# Patient Record
Sex: Male | Born: 1975 | Race: White | Hispanic: No | Marital: Married | Smoking: Former smoker
Health system: Southern US, Community
[De-identification: ages and names within clinical notes are randomized; demographics above are authoritative.]

## PROBLEM LIST (undated history)

## (undated) DIAGNOSIS — I10 Essential (primary) hypertension: Secondary | ICD-10-CM

---

## 2006-10-27 ENCOUNTER — Ambulatory Visit: Payer: Self-pay | Admitting: Internal Medicine

## 2006-12-08 ENCOUNTER — Ambulatory Visit: Payer: Self-pay | Admitting: Internal Medicine

## 2008-02-27 ENCOUNTER — Other Ambulatory Visit: Payer: Self-pay

## 2008-02-27 ENCOUNTER — Emergency Department: Payer: Self-pay | Admitting: Emergency Medicine

## 2008-08-21 ENCOUNTER — Emergency Department: Payer: Self-pay

## 2008-12-24 DIAGNOSIS — D239 Other benign neoplasm of skin, unspecified: Secondary | ICD-10-CM

## 2008-12-24 HISTORY — DX: Other benign neoplasm of skin, unspecified: D23.9

## 2011-10-20 ENCOUNTER — Ambulatory Visit: Payer: Self-pay | Admitting: Gastroenterology

## 2012-10-03 ENCOUNTER — Ambulatory Visit: Payer: Self-pay | Admitting: Sports Medicine

## 2012-11-23 ENCOUNTER — Ambulatory Visit: Payer: Self-pay | Admitting: Family Medicine

## 2017-08-07 DIAGNOSIS — G8929 Other chronic pain: Secondary | ICD-10-CM | POA: Insufficient documentation

## 2017-10-31 DIAGNOSIS — R002 Palpitations: Secondary | ICD-10-CM | POA: Insufficient documentation

## 2017-10-31 DIAGNOSIS — R079 Chest pain, unspecified: Secondary | ICD-10-CM | POA: Insufficient documentation

## 2017-10-31 DIAGNOSIS — R0602 Shortness of breath: Secondary | ICD-10-CM | POA: Insufficient documentation

## 2017-11-07 DIAGNOSIS — M5136 Other intervertebral disc degeneration, lumbar region: Secondary | ICD-10-CM | POA: Insufficient documentation

## 2019-03-21 DIAGNOSIS — K5732 Diverticulitis of large intestine without perforation or abscess without bleeding: Secondary | ICD-10-CM | POA: Insufficient documentation

## 2020-01-06 ENCOUNTER — Ambulatory Visit: Payer: 59 | Attending: Internal Medicine

## 2020-01-06 DIAGNOSIS — Z20822 Contact with and (suspected) exposure to covid-19: Secondary | ICD-10-CM | POA: Insufficient documentation

## 2020-01-07 LAB — NOVEL CORONAVIRUS, NAA: SARS-CoV-2, NAA: NOT DETECTED

## 2020-02-28 ENCOUNTER — Ambulatory Visit: Payer: Self-pay | Attending: Internal Medicine

## 2020-02-28 DIAGNOSIS — Z23 Encounter for immunization: Secondary | ICD-10-CM

## 2020-02-28 NOTE — Progress Notes (Signed)
   Covid-19 Vaccination Clinic  Name:  Henry Wu    MRN: [id number] DOB: 09/13/1976  02/28/2020  [name] was observed post Covid-19 immunization for 15 minutes without incident. He was provided with Vaccine Information Sheet and instruction to access the V-Safe system.   [name] was instructed to call 911 with any severe reactions post vaccine: Marland Kitchen Difficulty breathing  . Swelling of face and throat  . A fast heartbeat  . A bad rash all over body  . Dizziness and weakness   Immunizations Administered    Name Date Dose VIS Date Route   Pfizer COVID-19 Vaccine 02/28/2020 11:45 AM 0.3 mL 11/22/2019 Intramuscular   Manufacturer: [place]   Lot: [id number]   [place]: [id number]

## 2020-03-25 ENCOUNTER — Ambulatory Visit: Payer: Self-pay | Attending: Internal Medicine

## 2020-03-25 DIAGNOSIS — Z23 Encounter for immunization: Secondary | ICD-10-CM

## 2020-03-25 NOTE — Progress Notes (Signed)
   Covid-19 Vaccination Clinic  Name:  Henry Wu    MRN: [id number] DOB: 17-Mar-1976  03/25/2020  [name] was observed post Covid-19 immunization for 15 minutes without incident. He was provided with Vaccine Information Sheet and instruction to access the V-Safe system.   [name] was instructed to call 911 with any severe reactions post vaccine: Marland Kitchen Difficulty breathing  . Swelling of face and throat  . A fast heartbeat  . A bad rash all over body  . Dizziness and weakness   Immunizations Administered    Name Date Dose VIS Date Route   Pfizer COVID-19 Vaccine 03/25/2020 11:44 AM 0.3 mL 11/22/2019 Intramuscular   Manufacturer: [place]   Lot: [id number]   [place]: [id number]

## 2020-04-02 ENCOUNTER — Ambulatory Visit: Payer: Self-pay | Admitting: Dermatology

## 2020-05-14 ENCOUNTER — Other Ambulatory Visit: Payer: Self-pay | Admitting: Dermatology

## 2020-05-18 ENCOUNTER — Other Ambulatory Visit: Payer: Self-pay | Admitting: Dermatology

## 2020-06-08 ENCOUNTER — Ambulatory Visit (INDEPENDENT_AMBULATORY_CARE_PROVIDER_SITE_OTHER): Payer: 59 | Admitting: Dermatology

## 2020-06-08 ENCOUNTER — Other Ambulatory Visit: Payer: Self-pay

## 2020-06-08 DIAGNOSIS — L814 Other melanin hyperpigmentation: Secondary | ICD-10-CM

## 2020-06-08 DIAGNOSIS — L578 Other skin changes due to chronic exposure to nonionizing radiation: Secondary | ICD-10-CM

## 2020-06-08 DIAGNOSIS — D485 Neoplasm of uncertain behavior of skin: Secondary | ICD-10-CM

## 2020-06-08 DIAGNOSIS — L409 Psoriasis, unspecified: Secondary | ICD-10-CM | POA: Diagnosis not present

## 2020-06-08 DIAGNOSIS — D1800 Hemangioma unspecified site: Secondary | ICD-10-CM

## 2020-06-08 DIAGNOSIS — D229 Melanocytic nevi, unspecified: Secondary | ICD-10-CM

## 2020-06-08 DIAGNOSIS — Z1283 Encounter for screening for malignant neoplasm of skin: Secondary | ICD-10-CM

## 2020-06-08 MED ORDER — MOMETASONE FUROATE 0.1 % EX CREA: 1.0000 "application " | TOPICAL_CREAM | Freq: Every day | CUTANEOUS | 3 refills | Status: DC

## 2020-06-08 MED ORDER — MUPIROCIN 2 % EX OINT: 1.0000 "application " | TOPICAL_OINTMENT | Freq: Every day | CUTANEOUS | 2 refills | Status: DC

## 2020-06-08 NOTE — Patient Instructions (Addendum)

## 2020-06-08 NOTE — Progress Notes (Signed)
Follow-Up Visit   Subjective  Henry Wu is a 44 y.o. male who presents for the following: Follow-up. The patient presents for Upper Body Skin Exam (UBSE) for skin cancer screening and mole check. Patient here for 6 month psoriasis and eczema follow up. Patient uses Elocon for the eczema and needs refills.  Patient taking Donnetta Hail for psoriasis and is doing well. He would like a prescription for something topical to have on hand for flares when needed. Psoriasis is at chest and present for years. He has been on Pablo for about 3 years. He has also done Xtrac in the past.   The following portions of the chart were reviewed this encounter and updated as appropriate:  Allergies  Meds  Problems  Med Hx  Surg Hx  Fam Hx      Review of Systems:  No other skin or systemic complaints except as noted in HPI or Assessment and Plan.  Objective  Well appearing patient in no apparent distress; mood and affect are within normal limits.  A focused examination was performed including face, neck, chest and back and arms, feet, legs. Relevant physical exam findings are noted in the Assessment and Plan.  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  Start mupirocin to affected areas that are crusted daily.  Patient with some joint aches at the back, not sure if Donnetta Hail has helped. May need rheumatology evaluation.  Patient had labs done 10/03/2019 WNL. Will order labs next visit.   Ordered Medications: mometasone (ELOCON) 0.1 % cream mupirocin ointment  (BACTROBAN) 2 %  Neoplasm of uncertain behavior of skin (2) Left medial distal calf above the ankle  Epidermal / dermal shaving  Lesion diameter (cm):  0.5 Informed consent: discussed and consent obtained   Timeout: patient name, date of birth, surgical site, and procedure verified   Procedure prep:  Patient was prepped and draped in usual sterile fashion Prep type:  Isopropyl alcohol Anesthesia: the lesion was anesthetized in a standard fashion   Anesthetic:  1% lidocaine w/ epinephrine 1-100,000 buffered w/ 8.4% NaHCO3 Instrument used: flexible razor blade   Hemostasis achieved with: pressure, aluminum chloride and electrodesiccation   Outcome: patient tolerated procedure well   Post-procedure details: sterile dressing applied and wound care instructions given   Dressing type: bandage and petrolatum    Specimen 1 - Surgical pathology Differential Diagnosis: Nevus vs Dysplastic Nevus Check Margins: No 0.5cm irregular brown macule  Left proximal post calf  Epidermal / dermal shaving  Lesion diameter (cm):  0.3 Informed consent: discussed and consent obtained   Timeout: patient name, date of birth, surgical site, and procedure verified   Procedure prep:  Patient was prepped and draped in usual sterile fashion Prep type:  Isopropyl alcohol Anesthesia: the lesion was anesthetized in a standard fashion   Anesthetic:  1% lidocaine w/ epinephrine 1-100,000 buffered w/ 8.4% NaHCO3 Instrument used: flexible razor blade   Hemostasis achieved with: pressure, aluminum chloride and electrodesiccation   Outcome: patient tolerated procedure well   Post-procedure details: sterile dressing applied and  wound care instructions given   Dressing type: bandage and petrolatum    Specimen 2 - Surgical pathology Differential Diagnosis: Nevus vs Dysplastic Nevus Check Margins: No 0.3cm irregular brown macule  0.8cm post tx defect at left medial distal calf above ankle 0.6cm post tx defect at  left proximal post calf  Actinic Damage - diffuse scaly erythematous macules with underlying dyspigmentation - Recommend daily broad spectrum sunscreen SPF 30+ to sun-exposed areas, reapply every 2 hours as needed.  - Call for new or changing lesions.  Melanocytic Nevi - Tan-brown and/or pink-flesh-colored symmetric macules and papules - Benign appearing on exam today - Observation - Call clinic for new or changing moles - Recommend daily use of broad spectrum spf 30+ sunscreen to sun-exposed areas.   Lentigines - Scattered tan macules - Discussed due to sun exposure - Benign, observe - Call for any changes  Hemangiomas - Red papules - Discussed benign nature - Observe - Call for any changes  Return in about 6 months (around 12/08/2020) for psoriasis.  Graciella Belton, RMA, am acting as scribe for Sarina Ser, MD .  Documentation: I have reviewed the above documentation for accuracy and completeness, and I agree with the above.  Sarina Ser, MD

## 2020-06-11 ENCOUNTER — Encounter: Payer: Self-pay | Admitting: Dermatology

## 2020-06-11 ENCOUNTER — Telehealth: Payer: Self-pay

## 2020-06-11 NOTE — Telephone Encounter (Signed)
-----   Message from Ralene Bathe, MD sent at 06/11/2020  9:00 AM EDT ----- 1. Skin , left medial distal calf above the ankle SOLAR LENTIGO 2. 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

## 2020-06-11 NOTE — Telephone Encounter (Signed)
Patient informed of pathology results 

## 2020-06-21 ENCOUNTER — Encounter: Payer: Self-pay | Admitting: Dermatology

## 2020-07-29 ENCOUNTER — Other Ambulatory Visit: Payer: Self-pay

## 2020-07-29 MED ORDER — TALTZ 80 MG/ML ~~LOC~~ SOAJ
SUBCUTANEOUS | 2 refills | Status: DC
Start: 2020-07-29 — End: 2020-10-15

## 2020-07-29 NOTE — Progress Notes (Signed)
Refills needed for Taltz prescription. aw

## 2020-10-15 ENCOUNTER — Other Ambulatory Visit: Payer: Self-pay

## 2020-10-15 MED ORDER — TALTZ 80 MG/ML ~~LOC~~ SOAJ: SUBCUTANEOUS | 2 refills | Status: DC

## 2020-10-15 NOTE — Progress Notes (Signed)
RX RF sent in per patients request.

## 2020-11-02 ENCOUNTER — Other Ambulatory Visit: Payer: Self-pay

## 2020-11-02 ENCOUNTER — Ambulatory Visit (INDEPENDENT_AMBULATORY_CARE_PROVIDER_SITE_OTHER): Payer: 59 | Admitting: Dermatology

## 2020-11-02 DIAGNOSIS — L578 Other skin changes due to chronic exposure to nonionizing radiation: Secondary | ICD-10-CM

## 2020-11-02 DIAGNOSIS — L409 Psoriasis, unspecified: Secondary | ICD-10-CM | POA: Diagnosis not present

## 2020-11-02 DIAGNOSIS — D229 Melanocytic nevi, unspecified: Secondary | ICD-10-CM

## 2020-11-02 DIAGNOSIS — L820 Inflamed seborrheic keratosis: Secondary | ICD-10-CM | POA: Diagnosis not present

## 2020-11-02 DIAGNOSIS — D2361 Other benign neoplasm of skin of right upper limb, including shoulder: Secondary | ICD-10-CM

## 2020-11-02 DIAGNOSIS — L814 Other melanin hyperpigmentation: Secondary | ICD-10-CM

## 2020-11-02 DIAGNOSIS — L918 Other hypertrophic disorders of the skin: Secondary | ICD-10-CM

## 2020-11-02 DIAGNOSIS — D1800 Hemangioma unspecified site: Secondary | ICD-10-CM

## 2020-11-02 DIAGNOSIS — D485 Neoplasm of uncertain behavior of skin: Secondary | ICD-10-CM

## 2020-11-02 DIAGNOSIS — L821 Other seborrheic keratosis: Secondary | ICD-10-CM

## 2020-11-02 DIAGNOSIS — Z1283 Encounter for screening for malignant neoplasm of skin: Secondary | ICD-10-CM | POA: Diagnosis not present

## 2020-11-02 NOTE — Progress Notes (Signed)
Follow-Up Visit   Subjective  Henry Wu is a 44 y.o. male who presents for the following: Follow-up (Patient here today for 6 month psoriasis follow up. He is using Materials engineer monthly and Elocon as needed. ).  Patient advises he only has a few scattered plaques. His specialty pharmacy had messed up his Taltz prescription so he went without for 2-3 months per patient.  Patient due for labs today.  The patient presents for Total-Body Skin Exam (TBSE) for skin cancer screening and mole check.  The following portions of the chart were reviewed this encounter and updated as appropriate:  Allergies  Meds  Problems  Med Hx  Surg Hx  Fam Hx     Review of Systems:  No other skin or systemic complaints except as noted in HPI or Assessment and Plan.  Objective  Well appearing patient in no apparent distress; mood and affect are within normal limits.  A focused examination was performed including face, neck, chest and back and legs, hands, arms. Relevant physical exam findings are noted in the Assessment and Plan.  Objective  Right Lower Leg - Anterior: Guttate spots on legs and hands  Objective  Right Anterior Deltoid: 0.6cm pink papule  Objective  Left epigastric: Erythematous keratotic or waxy stuck-on papule or plaque.    Assessment & Plan  Psoriasis Right Lower Leg - Anterior  Patient does have back pain If pain does not improve on Taltz, recommend patient be re-evaluated by rheumatologist.   RE-Start Taltz monthly Continue Elocon daily as needed  Psoriasis - severe on systemic "biologic" treatment injections.  Psoriasis is a chronic non-curable, but treatable genetic/hereditary disease that may have other systemic features affecting other organ systems such as joints (Psoriatic Arthritis).  It is linked with heart disease, inflammatory bowel disease, non-alcoholic fatty liver disease, and depression. Significant skin psoriasis and/or psoriatic arthritis may have  significant symptoms and affects activities of daily activity and often benefits from systemic "biologic" injection treatments.  These "biologic" treatments have some potential side effects including immunosuppression and require pre-treatment laboratory screening and periodic laboratory monitoring and periodic in person evaluation and monitoring by the attending dermatologist physician.  Other Related Procedures Comprehensive metabolic panel CBC with Differential/Platelet Lipid panel QuantiFERON-TB Gold Plus  Other Related Medications mometasone (ELOCON) 0.1 % cream  Neoplasm of uncertain behavior of skin Right Anterior Deltoid  Epidermal / dermal shaving  Lesion diameter (cm):  0.6 Informed consent: discussed and consent obtained   Timeout: patient name, date of birth, surgical site, and procedure verified   Procedure prep:  Patient was prepped and draped in usual sterile fashion Prep type:  Isopropyl alcohol Anesthesia: the lesion was anesthetized in a standard fashion   Anesthetic:  1% lidocaine w/ epinephrine 1-100,000 buffered w/ 8.4% NaHCO3 Instrument used: flexible razor blade   Hemostasis achieved with: pressure, aluminum chloride and electrodesiccation   Outcome: patient tolerated procedure well   Post-procedure details: sterile dressing applied and wound care instructions given   Dressing type: bandage and petrolatum    Destruction of lesion Complexity: extensive   Destruction method: electrodesiccation and curettage   Informed consent: discussed and consent obtained   Timeout:  patient name, date of birth, surgical site, and procedure verified Procedure prep:  Patient was prepped and draped in usual sterile fashion Prep type:  Isopropyl alcohol Anesthesia: the lesion was anesthetized in a standard fashion   Anesthetic:  1% lidocaine w/ epinephrine 1-100,000 buffered w/ 8.4% NaHCO3 Curettage performed in three different directions: Yes  Electrodesiccation performed  over the curetted area: Yes   Lesion length (cm):  0.6 Lesion width (cm):  0.6 Margin per side (cm):  0.2 Final wound size (cm):  1 Hemostasis achieved with:  pressure, aluminum chloride and electrodesiccation Outcome: patient tolerated procedure well with no complications   Post-procedure details: sterile dressing applied and wound care instructions given   Dressing type: bandage and petrolatum    Specimen 1 - Surgical pathology Differential Diagnosis: r/o BCC vs Dermatofibroma  Check Margins: No 0.6cm pink papule  Inflamed seborrheic keratosis Left epigastric  Destruction of lesion - Left epigastric Complexity: simple   Destruction method: cryotherapy   Informed consent: discussed and consent obtained   Timeout:  patient name, date of birth, surgical site, and procedure verified Lesion destroyed using liquid nitrogen: Yes   Region frozen until ice ball extended beyond lesion: Yes   Outcome: patient tolerated procedure well with no complications   Post-procedure details: wound care instructions given    History of Dysplastic Nevi - No evidence of recurrence today at left prox post calf - Recommend regular full body skin exams - Recommend daily broad spectrum sunscreen SPF 30+ to sun-exposed areas, reapply every 2 hours as needed.  - Call if any new or changing lesions are noted between office visits  Lentigines - Scattered tan macules - Discussed due to sun exposure - Benign, observe - Call for any changes  Seborrheic Keratoses - Stuck-on, waxy, tan-brown papules and plaques  - Discussed benign etiology and prognosis. - Observe - Call for any changes  Melanocytic Nevi - Tan-brown and/or pink-flesh-colored symmetric macules and papules - Benign appearing on exam today - Observation - Call clinic for new or changing moles - Recommend daily use of broad spectrum spf 30+ sunscreen to sun-exposed areas.   Hemangiomas - Red papules - Discussed benign nature -  Observe - Call for any changes  Actinic Damage - Chronic, secondary to cumulative UV/sun exposure - diffuse scaly erythematous macules with underlying dyspigmentation - Recommend daily broad spectrum sunscreen SPF 30+ to sun-exposed areas, reapply every 2 hours as needed.  - Call for new or changing lesions.  Skin cancer screening performed today.  Acrochordons (Skin Tags) - Fleshy, skin-colored pedunculated papules - Benign appearing.  - Observe. - If desired, they can be removed with an in office procedure that is not covered by insurance. - Please call the clinic if you notice any new or changing lesions.  Return in about 6 months (around 05/02/2021) for Psoriasis.  Graciella Belton, RMA, am acting as scribe for Sarina Ser, MD . Documentation: I have reviewed the above documentation for accuracy and completeness, and I agree with the above.  Sarina Ser, MD

## 2020-11-02 NOTE — Patient Instructions (Addendum)
Melanoma ABCDEs  Melanoma is the most dangerous type of skin cancer, and is the leading cause of death from skin disease.  You are more likely to develop melanoma if you:  Have light-colored skin, light-colored eyes, or red or blond hair  Spend a lot of time in the sun  Tan regularly, either outdoors or in a tanning bed  Have had blistering sunburns, especially during childhood  Have a close family member who has had a melanoma  Have atypical moles or large birthmarks  Early detection of melanoma is key since treatment is typically straightforward and cure rates are extremely high if we catch it early.   The first sign of melanoma is often a change in a mole or a new dark spot.  The ABCDE system is a way of remembering the signs of melanoma.  A for asymmetry:  The two halves do not match. B for border:  The edges of the growth are irregular. C for color:  A mixture of colors are present instead of an even brown color. D for diameter:  Melanomas are usually (but not always) greater than 21mm - the size of a pencil eraser. E for evolution:  The spot keeps changing in size, shape, and color.  Please check your skin once per month between visits. You can use a small mirror in front and a large mirror behind you to keep an eye on the back side or your body.   If you see any new or changing lesions before your next follow-up, please call to schedule a visit.  Please continue daily skin protection including broad spectrum sunscreen SPF 30+ to sun-exposed areas, reapplying every 2 hours as needed when you're outdoors.   Wound Care Instructions  1. Cleanse wound gently with soap and water once a day then pat dry with clean gauze. Apply a thing coat of Petrolatum (petroleum jelly, "Vaseline") over the wound (unless you have an allergy to this). We recommend that you use a new, sterile tube of Vaseline. Do not pick or remove scabs. Do not remove the yellow or white "healing tissue" from the  base of the wound.  2. Cover the wound with fresh, clean, nonstick gauze and secure with paper tape. You may use Band-Aids in place of gauze and tape if the would is small enough, but would recommend trimming much of the tape off as there is often too much. Sometimes Band-Aids can irritate the skin.  3. You should call the office for your biopsy report after 1 week if you have not already been contacted.  4. If you experience any problems, such as abnormal amounts of bleeding, swelling, significant bruising, significant pain, or evidence of infection, please call the office immediately.  5. FOR ADULT SURGERY PATIENTS: If you need something for pain relief you may take 1 extra strength Tylenol (acetaminophen) AND 2 Ibuprofen (200mg  each) together every 4 hours as needed for pain. (do not take these if you are allergic to them or if you have a reason you should not take them.) Typically, you may only need pain medication for 1 to 3 days.    Cryotherapy Aftercare  . Wash gently with soap and water everyday.   Marland Kitchen Apply Vaseline and Band-Aid daily until healed.

## 2020-11-04 ENCOUNTER — Telehealth: Payer: Self-pay

## 2020-11-04 LAB — CBC WITH DIFFERENTIAL/PLATELET
Basophils Absolute: 0 10*3/uL (ref 0.0–0.2)
Basos: 1 %
EOS (ABSOLUTE): 0.2 10*3/uL (ref 0.0–0.4)
Eos: 2 %
Hematocrit: 38.8 % (ref 37.5–51.0)
Hemoglobin: 13.4 g/dL (ref 13.0–17.7)
Immature Grans (Abs): 0 10*3/uL (ref 0.0–0.1)
Immature Granulocytes: 0 %
Lymphocytes Absolute: 2 10*3/uL (ref 0.7–3.1)
Lymphs: 26 %
MCH: 31.3 pg (ref 26.6–33.0)
MCHC: 34.5 g/dL (ref 31.5–35.7)
MCV: 91 fL (ref 79–97)
Monocytes Absolute: 0.7 10*3/uL (ref 0.1–0.9)
Monocytes: 10 %
Neutrophils Absolute: 4.7 10*3/uL (ref 1.4–7.0)
Neutrophils: 61 %
Platelets: 204 10*3/uL (ref 150–450)
RBC: 4.28 x10E6/uL (ref 4.14–5.80)
RDW: 13.1 % (ref 11.6–15.4)
WBC: 7.7 10*3/uL (ref 3.4–10.8)

## 2020-11-04 LAB — LIPID PANEL
Chol/HDL Ratio: 3.3 ratio (ref 0.0–5.0)
Cholesterol, Total: 167 mg/dL (ref 100–199)
HDL: 50 mg/dL (ref >39)
LDL Chol Calc (NIH): 96 mg/dL (ref 0–99)
Triglycerides: 115 mg/dL (ref 0–149)
VLDL Cholesterol Cal: 21 mg/dL (ref 5–40)

## 2020-11-04 LAB — QUANTIFERON-TB GOLD PLUS
QuantiFERON Mitogen Value: >10 IU/mL
QuantiFERON Nil Value: 0.41 IU/mL
QuantiFERON TB1 Ag Value: 1.06 IU/mL
QuantiFERON TB2 Ag Value: 1.05 IU/mL
QuantiFERON-TB Gold Plus: POSITIVE — AB

## 2020-11-04 LAB — COMPREHENSIVE METABOLIC PANEL
ALT: 28 IU/L (ref 0–44)
AST: 18 IU/L (ref 0–40)
Albumin/Globulin Ratio: 1.7 (ref 1.2–2.2)
Albumin: 4.4 g/dL (ref 4.0–5.0)
Alkaline Phosphatase: 50 IU/L (ref 44–121)
BUN/Creatinine Ratio: 13 (ref 9–20)
BUN: 17 mg/dL (ref 6–24)
Bilirubin Total: 0.5 mg/dL (ref 0.0–1.2)
CO2: 24 mmol/L (ref 20–29)
Calcium: 9.5 mg/dL (ref 8.7–10.2)
Chloride: 103 mmol/L (ref 96–106)
Creatinine, Ser: 1.29 mg/dL — ABNORMAL HIGH (ref 0.76–1.27)
GFR calc Af Amer: 79 mL/min/{1.73_m2} (ref >59)
GFR calc non Af Amer: 68 mL/min/{1.73_m2} (ref >59)
Globulin, Total: 2.6 g/dL (ref 1.5–4.5)
Glucose: 87 mg/dL (ref 65–99)
Potassium: 4.4 mmol/L (ref 3.5–5.2)
Sodium: 141 mmol/L (ref 134–144)
Total Protein: 7 g/dL (ref 6.0–8.5)

## 2020-11-04 NOTE — Telephone Encounter (Signed)
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

## 2020-11-04 NOTE — Telephone Encounter (Signed)
Patient informed of pathology results 

## 2020-11-10 ENCOUNTER — Encounter: Payer: Self-pay | Admitting: Dermatology

## 2020-11-10 ENCOUNTER — Telehealth: Payer: Self-pay

## 2020-11-10 NOTE — Telephone Encounter (Signed)
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  (May be a false positive.  1st step is to repeat lab - Quantiferon gold.  Please advise pt and order again.)  If 2nd test positive, will need to have Infectious Disease evaluation)  All other lab are Ok/normal.

## 2020-11-10 NOTE — Telephone Encounter (Signed)
LM on VM please return my call to discuss lab results  

## 2020-11-12 ENCOUNTER — Telehealth: Payer: Self-pay

## 2020-11-12 NOTE — Telephone Encounter (Signed)
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  (May be a false positive.  1st step is to repeat lab - Quantiferon gold.  Please advise pt and order again.)  If 2nd test positive, will need to have Infectious Disease evaluation)  All other lab are Ok/normal.

## 2020-11-12 NOTE — Telephone Encounter (Signed)
Left message on voicemail to return my call.  

## 2020-11-24 ENCOUNTER — Telehealth: Payer: Self-pay

## 2020-11-24 NOTE — Telephone Encounter (Signed)
Left message on voicemail to return my call.  

## 2020-11-24 NOTE — Telephone Encounter (Signed)
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  (May be a false positive.  1st step is to repeat lab - Quantiferon gold.  Please advise pt and order again.)  If 2nd test positive, will need to have Infectious Disease evaluation)  All other lab are Ok/normal.

## 2020-11-24 NOTE — Progress Notes (Signed)
Sent letter to advise patient of lab results and the need to have Quantiferon Gold lab redrawn.

## 2020-11-30 ENCOUNTER — Telehealth: Payer: Self-pay

## 2020-11-30 DIAGNOSIS — L409 Psoriasis, unspecified: Secondary | ICD-10-CM

## 2020-11-30 NOTE — Telephone Encounter (Signed)
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 (May be a false positive. 1st step is to repeat lab - Quantiferon gold. Please advise pt and order again.) If 2nd test positive, will need to have Infectious Disease evaluation)  Pt will come by today to pick up a lab order for TB test

## 2020-12-05 LAB — QUANTIFERON-TB GOLD PLUS
QuantiFERON Mitogen Value: >10 IU/mL
QuantiFERON Nil Value: 0.49 IU/mL
QuantiFERON TB1 Ag Value: 0.88 IU/mL
QuantiFERON TB2 Ag Value: 0.68 IU/mL
QuantiFERON-TB Gold Plus: POSITIVE — AB

## 2020-12-08 ENCOUNTER — Telehealth: Payer: Self-pay

## 2020-12-08 ENCOUNTER — Other Ambulatory Visit: Payer: Self-pay

## 2020-12-08 DIAGNOSIS — R7611 Nonspecific reaction to tuberculin skin test without active tuberculosis: Secondary | ICD-10-CM

## 2020-12-08 DIAGNOSIS — L409 Psoriasis, unspecified: Secondary | ICD-10-CM

## 2020-12-08 NOTE — Telephone Encounter (Signed)
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  Pt had negative TB test prior to starting Taltz. Please contact Dr Champ Mungo or Dr Rivka Safer - both are Infectious Disease specialists  And schedule appt.  Advise of : "positive TB test x 2 as above and pt currently on Biologic medication Taltz for Psoriasis"  Pt needs seen ASAP.  If he cannot be seen soon, ask Infectious Disease doctor if they want [place] to do any other testing or start any treatment in the meantime.

## 2020-12-08 NOTE — Telephone Encounter (Signed)
Pt scheduled with Dr Rivka Safer Tuesday January 4 at 10:15, pt aware of this appointment, I will fax labs to Dr Rivka Safer

## 2020-12-08 NOTE — Telephone Encounter (Signed)
Called pt discussed 2nd positive TB test we will refer him to infectious disease specialist, I will call pt back with appt information.

## 2020-12-15 ENCOUNTER — Encounter: Payer: Self-pay | Admitting: Infectious Diseases

## 2020-12-15 ENCOUNTER — Other Ambulatory Visit
Admission: RE | Admit: 2020-12-15 | Discharge: 2020-12-15 | Disposition: A | Discharge status: Home / Self Care | Payer: 59 | Source: Ambulatory Visit | Attending: Infectious Diseases | Admitting: Infectious Diseases

## 2020-12-15 ENCOUNTER — Ambulatory Visit: Payer: 59 | Admitting: Infectious Diseases

## 2020-12-15 ENCOUNTER — Ambulatory Visit: Payer: 59 | Attending: Infectious Diseases | Admitting: Infectious Diseases

## 2020-12-15 ENCOUNTER — Ambulatory Visit
Admission: RE | Admit: 2020-12-15 | Discharge: 2020-12-15 | Disposition: A | Discharge status: Home / Self Care | Payer: 59 | Source: Ambulatory Visit | Attending: Infectious Diseases | Admitting: Infectious Diseases

## 2020-12-15 ENCOUNTER — Other Ambulatory Visit: Payer: Self-pay

## 2020-12-15 ENCOUNTER — Ambulatory Visit: Payer: 59

## 2020-12-15 VITALS — BP 140/93 | HR 74 | Resp 16 | Ht 72.0 in | Wt 282.0 lb

## 2020-12-15 DIAGNOSIS — Z886 Allergy status to analgesic agent status: Secondary | ICD-10-CM | POA: Insufficient documentation

## 2020-12-15 DIAGNOSIS — Z23 Encounter for immunization: Secondary | ICD-10-CM

## 2020-12-15 DIAGNOSIS — Z87891 Personal history of nicotine dependence: Secondary | ICD-10-CM | POA: Insufficient documentation

## 2020-12-15 DIAGNOSIS — L409 Psoriasis, unspecified: Secondary | ICD-10-CM | POA: Diagnosis present

## 2020-12-15 DIAGNOSIS — Z79899 Other long term (current) drug therapy: Secondary | ICD-10-CM | POA: Diagnosis not present

## 2020-12-15 DIAGNOSIS — Z227 Latent tuberculosis: Secondary | ICD-10-CM | POA: Diagnosis present

## 2020-12-15 DIAGNOSIS — I10 Essential (primary) hypertension: Secondary | ICD-10-CM | POA: Diagnosis not present

## 2020-12-15 DIAGNOSIS — Z86018 Personal history of other benign neoplasm: Secondary | ICD-10-CM | POA: Diagnosis not present

## 2020-12-15 DIAGNOSIS — R7612 Nonspecific reaction to cell mediated immunity measurement of gamma interferon antigen response without active tuberculosis: Secondary | ICD-10-CM | POA: Diagnosis not present

## 2020-12-15 LAB — HEPATIC FUNCTION PANEL
ALT: 34 U/L (ref 0–44)
AST: 21 U/L (ref 15–41)
Albumin: 4.3 g/dL (ref 3.5–5.0)
Alkaline Phosphatase: 45 U/L (ref 38–126)
Bilirubin, Direct: 0.1 mg/dL (ref 0.0–0.2)
Indirect Bilirubin: 0.9 mg/dL (ref 0.3–0.9)
Total Bilirubin: 1 mg/dL (ref 0.3–1.2)
Total Protein: 7.7 g/dL (ref 6.5–8.1)

## 2020-12-15 LAB — HEPATITIS PANEL, ACUTE
HCV Ab: NONREACTIVE
Hep A IgM: NONREACTIVE
Hep B C IgM: NONREACTIVE
Hepatitis B Surface Ag: NONREACTIVE

## 2020-12-15 MED ORDER — RIFAMPIN 300 MG PO CAPS: 600.0000 mg | ORAL_CAPSULE | Freq: Every day | ORAL | 3 refills | Status: DC

## 2020-12-15 NOTE — Progress Notes (Signed)
NAME: Henry Wu  DOB: 24-May-1976  MRN: [id number]  Date/Time: 12/15/2020 10:24 AM  Subjective:  Patient is referred to me by his dermatologist Dr. Nehemiah Massed for a positive QuantiFERON gold. ? Henry Wu is a 45 y.o. with a history of psoriasis, HTN is referred to me for positive quantiferon  gold Patient is on ixekizumab an antiinterleukin 17 monoclonal antibody for the past 2 years.  He has had yearly QuantiFERON gold which has been negative therefore but this time it became positive.  It was repeated in November and it was positive again and hence he was referred to me.  Patient does not have any cough, fever, night sweats, weight loss or loss of appetite. In the early 2000 he used to work at [employer] and has been exposed to TB in the specimens.  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 He is not on any steroids.  Past Medical History:  Diagnosis Date  . Dysplastic nevus 12/24/2008   Left mid to low back, paraspinal 0.6cm. Slight to moderate atypia, margins free.  Marland Kitchen Dysplastic nevus 06/08/2020   Left proximal post. calf. Moderate atypia, close to margin.     No past surgical history on file.  Social History   Socioeconomic History  . Marital status: Married    Spouse name: Not on file  . Number of children: Not on file  . Years of education: Not on file  . Highest education level: Not on file  Occupational History  . Not on file  Tobacco Use  . Smoking status: Former Smoker    Types: Cigarettes    Start date: 12/15/2000  . Smokeless tobacco: Never Used  Substance and Sexual Activity  . Alcohol use: Yes    Comment: occasionally   . Drug use: Never  . Sexual activity: Not on file  Other Topics Concern  . Not on file  Social History Narrative  . Not on file   Social Determinants of Health   Financial Resource Strain: Not on file  Food Insecurity:  Not on file  Transportation Needs: Not on file  Physical Activity: Not on file  Stress: Not on file  Social Connections: Not on file  Intimate Partner Violence: Not on file    No family history on file. Allergies  Allergen Reactions  . Aspirin Other (See Comments)    Other reaction(s): Unknown Causing stomach upset     ? Current Outpatient Medications  Medication Sig Dispense Refill  . ALPRAZolam (XANAX) 0.25 MG tablet Take by mouth.    . cetirizine (ZYRTEC) 10 MG tablet Take by mouth.    . fluticasone (FLONASE) 50 MCG/ACT nasal spray Place into the nose.    . Ixekizumab (TALTZ) 80 MG/ML SOAJ Inject 1 pen sq every 4 weeks 1 mL 2  . losartan (COZAAR) 50 MG tablet Take 50 mg by mouth daily.    . mometasone (ELOCON) 0.1 % cream Apply 1 application topically daily. 45 g 3  . Multiple Vitamin (MULTI-VITAMIN) tablet Take by mouth.    . pantoprazole (PROTONIX) 40 MG tablet Take 40 mg by mouth daily.     No current facility-administered medications for this visit.     Abtx:  Anti-infectives (From admission, onward)   None      REVIEW OF SYSTEMS:  Const: negative fever, negative chills, negative weight loss Eyes: negative diplopia or visual changes,  negative eye pain ENT: negative coryza, negative sore throat Resp: negative cough, hemoptysis, dyspnea Cards: negative for chest pain, palpitations, lower extremity edema GU: negative for frequency, dysuria and hematuria GI: Negative for abdominal pain, diarrhea, bleeding, constipation Skin: negative for rash and pruritus Heme: negative for easy bruising and gum/nose bleeding MS: negative for myalgias, arthralgias, back pain and muscle weakness Neurolo:negative for headaches, dizziness, vertigo, memory problems  Psych: negative for feelings of anxiety, depression  Endocrine: negative for thyroid, diabetes Allergy/Immunology-aspirin: Objective:  VITALS:  BP (!) 140/93   Pulse 74   Resp 16   Ht 6' (1.829 m)   Wt 282 lb  (127.9 kg)   SpO2 98%   BMI 38.25 kg/m  PHYSICAL EXAM:  General: Alert, cooperative, no distress, appears stated age.  Head: Normocephalic, without obvious abnormality, atraumatic. Eyes: Conjunctivae clear, anicteric sclerae. Pupils are equal ENT Nares normal. No drainage or sinus tenderness. Lips, mucosa, and tongue normal. No Thrush Neck: Supple, symmetrical, no adenopathy, thyroid: non tender no carotid bruit and no JVD. Back: No CVA tenderness. Lungs: Clear to auscultation bilaterally. No Wheezing or Rhonchi. No rales. Heart: Regular rate and rhythm, no murmur, rub or gallop. Abdomen: Soft, non-tender,not distended. Bowel sounds normal. No masses Extremities: atraumatic, no cyanosis. No edema. No clubbing Skin: No rashes or lesions. Or bruising Lymph: Cervical, supraclavicular normal. Neurologic: Grossly non-focal Pertinent Labs Lab Results CBC    Component Value Date/Time   WBC 7.7 11/02/2020 1002   RBC 4.28 11/02/2020 1002   HGB 13.4 11/02/2020 1002   HCT 38.8 11/02/2020 1002   PLT 204 11/02/2020 1002   MCV 91 11/02/2020 1002   MCH 31.3 11/02/2020 1002   MCHC 34.5 11/02/2020 1002   RDW 13.1 11/02/2020 1002   LYMPHSABS 2.0 11/02/2020 1002   EOSABS 0.2 11/02/2020 1002   BASOSABS 0.0 11/02/2020 1002    CMP Latest Ref Rng & Units 11/02/2020  Glucose 65 - 99 mg/dL 87  BUN 6 - 24 mg/dL 17  Creatinine 0.76 - 1.27 mg/dL 1.29(H)  Sodium 134 - 144 mmol/L 141  Potassium 3.5 - 5.2 mmol/L 4.4  Chloride 96 - 106 mmol/L 103  CO2 20 - 29 mmol/L 24  Calcium 8.7 - 10.2 mg/dL 9.5  Total Protein 6.0 - 8.5 g/dL 7.0  Total Bilirubin 0.0 - 1.2 mg/dL 0.5  Alkaline Phos 44 - 121 IU/L 50  AST 0 - 40 IU/L 18  ALT 0 - 44 IU/L 28    Impression/Recommendation ? ?Positive QuantiFERON gold.  As per patient it was negative in the past.  Need to get those records from Dr. Nehemiah Massed.  . Patient is asymptomatic. We will get a chest x-ray to rule out any lung lesions.  We will also get  hepatitis panel We will also check his LFTs If all of the above are negative we will start him on rifampin 600 mg once a day for 4 months because patient is on immunosuppressive therapy.  He has a negative HIV and RPR tested in 2020.  At an outside lab. ? __ Psoriasis on anti-interleukin-17 A monoclonal antibody for the past 2 years. Patient also received booster Pfizer mRNA vaccine and flu vaccine today.  _________________________________________________ Discussed the management with patient in detail.  Discussed the side effects of rifampin. Gave him printed material on rifampin to read. .  will let him know once we have the results. Note:  This document was prepared using Dragon voice recognition software and may include unintentional dictation errors. Addendum chest  x-ray normal Hepatitis panel normal LFTs normal

## 2020-12-15 NOTE — Progress Notes (Signed)
   Covid-19 Vaccination [facility]  Name:  Malin Cervini    MRN: [id number] DOB: 10-05-1976  12/15/2020  Mr. Juste was observed post Covid-19 immunization for 15 minutes without incident. He was provided with Vaccine Information Sheet and instruction to access the V-Safe system.   Mr. Lezotte was instructed to call 911 with any severe reactions post vaccine: Marland Kitchen Difficulty breathing  . Swelling of face and throat  . A fast heartbeat  . A bad rash all over body  . Dizziness and weakness   Immunizations Administered    Name Date Dose VIS Date Route   Pfizer COVID-19 Vaccine 12/15/2020 12:07 PM 0.3 mL 09/30/2020 Intramuscular   Manufacturer: [employer], [employer]   Lot: [id number]   NDC: [address]-0    Clayborne Artist CMA Lead

## 2020-12-15 NOTE — Patient Instructions (Addendum)
You are here for a positive quantiferon gold- you have been on Talz for psoriasis an interluekin 17 inhibitor You need to get CXR, will also check hepatitis  profile before starting rifampin

## 2020-12-17 ENCOUNTER — Telehealth: Payer: Self-pay

## 2020-12-17 NOTE — Telephone Encounter (Signed)
call patient and tell him to start Rifampin for positive quantiferon gold- His liver test and CXR okay. thxPrescription sent to his paharmacy alreay  Left detailed VM on his cell and also discussed with Leanne his wife.

## 2021-01-04 ENCOUNTER — Telehealth: Payer: Self-pay

## 2021-01-04 NOTE — Telephone Encounter (Signed)
Please HOLD Taltz until we get advice from Infectious Disease.

## 2021-01-04 NOTE — Telephone Encounter (Signed)
We have received a [facility] from [facility] asking for clarification on taltz rx due to tb infection. They are inquiring if they should continue to fill, hold the script, or if we want to change it. Please advise.

## 2021-01-05 ENCOUNTER — Other Ambulatory Visit: Payer: Self-pay

## 2021-01-05 DIAGNOSIS — L409 Psoriasis, unspecified: Secondary | ICD-10-CM

## 2021-01-05 MED ORDER — TALTZ 80 MG/ML ~~LOC~~ SOAJ: SUBCUTANEOUS | 2 refills | Status: DC

## 2021-01-07 ENCOUNTER — Telehealth: Payer: Self-pay

## 2021-01-07 NOTE — Telephone Encounter (Signed)
I had to called and clarify with [facility] that Donnetta Hail was being prescribed and filled with rifampin prescription and this was [facility] with Infectious Disease doctor and dermatologist. Pharmacist states if patient has any contraindications then doctors will be responsible for patient.

## 2021-01-19 ENCOUNTER — Telehealth: Payer: 59 | Admitting: Infectious Diseases

## 2021-01-28 ENCOUNTER — Ambulatory Visit: Payer: 59 | Attending: Infectious Diseases | Admitting: Infectious Diseases

## 2021-01-28 ENCOUNTER — Other Ambulatory Visit: Payer: Self-pay

## 2021-01-28 DIAGNOSIS — K649 Unspecified hemorrhoids: Secondary | ICD-10-CM | POA: Insufficient documentation

## 2021-01-28 DIAGNOSIS — L409 Psoriasis, unspecified: Secondary | ICD-10-CM | POA: Insufficient documentation

## 2021-01-28 DIAGNOSIS — R7612 Nonspecific reaction to cell mediated immunity measurement of gamma interferon antigen response without active tuberculosis: Secondary | ICD-10-CM

## 2021-01-28 DIAGNOSIS — F419 Anxiety disorder, unspecified: Secondary | ICD-10-CM | POA: Insufficient documentation

## 2021-01-28 DIAGNOSIS — G4730 Sleep apnea, unspecified: Secondary | ICD-10-CM | POA: Insufficient documentation

## 2021-01-28 DIAGNOSIS — L309 Dermatitis, unspecified: Secondary | ICD-10-CM | POA: Insufficient documentation

## 2021-01-28 DIAGNOSIS — K469 Unspecified abdominal hernia without obstruction or gangrene: Secondary | ICD-10-CM | POA: Insufficient documentation

## 2021-01-28 DIAGNOSIS — F429 Obsessive-compulsive disorder, unspecified: Secondary | ICD-10-CM | POA: Insufficient documentation

## 2021-01-28 NOTE — Progress Notes (Signed)
The purpose of this virtual visit is to provide medical care while limiting exposure to the novel coronavirus (COVID19) for both patient and office staff.  Telephone visit Consent was obtained for phone visit:  Yes.   Answered questions that patient had about telehealth interaction:  Yes.   I discussed the limitations, risks, security and privacy concerns of performing an evaluation and management service by telephone. I also discussed with the patient that there may be a patient responsible charge related to this service. The patient expressed understanding and agreed to proceed.   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  We will do labs to check his LFTs and basic metabolic panel and CBC. Follow-up after labs. Spent 11 minutes on the telephone visit call.

## 2021-02-01 ENCOUNTER — Other Ambulatory Visit: Payer: Self-pay | Admitting: Infectious Diseases

## 2021-02-02 ENCOUNTER — Telehealth: Payer: Self-pay

## 2021-02-02 LAB — COMPREHENSIVE METABOLIC PANEL
ALT: 17 IU/L (ref 0–44)
AST: 17 IU/L (ref 0–40)
Albumin/Globulin Ratio: 1.6 (ref 1.2–2.2)
Albumin: 4.7 g/dL (ref 4.0–5.0)
Alkaline Phosphatase: 76 IU/L (ref 44–121)
BUN/Creatinine Ratio: 11 (ref 9–20)
BUN: 13 mg/dL (ref 6–24)
Bilirubin Total: 0.8 mg/dL (ref 0.0–1.2)
CO2: 21 mmol/L (ref 20–29)
Calcium: 9.2 mg/dL (ref 8.7–10.2)
Chloride: 102 mmol/L (ref 96–106)
Creatinine, Ser: 1.15 mg/dL (ref 0.76–1.27)
GFR calc Af Amer: 90 mL/min/{1.73_m2} (ref >59)
GFR calc non Af Amer: 78 mL/min/{1.73_m2} (ref >59)
Globulin, Total: 2.9 g/dL (ref 1.5–4.5)
Glucose: 86 mg/dL (ref 65–99)
Potassium: 4 mmol/L (ref 3.5–5.2)
Sodium: 138 mmol/L (ref 134–144)
Total Protein: 7.6 g/dL (ref 6.0–8.5)

## 2021-02-02 LAB — CBC WITH DIFFERENTIAL/PLATELET
Basophils Absolute: 0 10*3/uL (ref 0.0–0.2)
Basos: 1 %
EOS (ABSOLUTE): 0.2 10*3/uL (ref 0.0–0.4)
Eos: 2 %
Hematocrit: 41.9 % (ref 37.5–51.0)
Hemoglobin: 14.5 g/dL (ref 13.0–17.7)
Immature Grans (Abs): 0 10*3/uL (ref 0.0–0.1)
Immature Granulocytes: 0 %
Lymphocytes Absolute: 2 10*3/uL (ref 0.7–3.1)
Lymphs: 24 %
MCH: 30.7 pg (ref 26.6–33.0)
MCHC: 34.6 g/dL (ref 31.5–35.7)
MCV: 89 fL (ref 79–97)
Monocytes Absolute: 0.7 10*3/uL (ref 0.1–0.9)
Monocytes: 9 %
Neutrophils Absolute: 5.6 10*3/uL (ref 1.4–7.0)
Neutrophils: 64 %
Platelets: 199 10*3/uL (ref 150–450)
RBC: 4.73 x10E6/uL (ref 4.14–5.80)
RDW: 13.1 % (ref 11.6–15.4)
WBC: 8.6 10*3/uL (ref 3.4–10.8)

## 2021-02-02 NOTE — Telephone Encounter (Signed)
Advised wife of lab results.

## 2021-03-11 ENCOUNTER — Other Ambulatory Visit: Payer: Self-pay | Admitting: General Surgery

## 2021-03-11 DIAGNOSIS — R1032 Left lower quadrant pain: Secondary | ICD-10-CM

## 2021-03-25 ENCOUNTER — Other Ambulatory Visit: Payer: Self-pay

## 2021-03-25 DIAGNOSIS — L409 Psoriasis, unspecified: Secondary | ICD-10-CM

## 2021-03-25 MED ORDER — TALTZ 80 MG/ML ~~LOC~~ SOAJ: SUBCUTANEOUS | 2 refills | Status: DC

## 2021-03-25 NOTE — Progress Notes (Signed)
Taltz RX RF until June appt.

## 2021-03-30 ENCOUNTER — Ambulatory Visit
Admission: RE | Admit: 2021-03-30 | Discharge: 2021-03-30 | Disposition: A | Discharge status: Home / Self Care | Payer: BC Managed Care – PPO | Source: Ambulatory Visit | Attending: General Surgery | Admitting: General Surgery

## 2021-03-30 ENCOUNTER — Other Ambulatory Visit: Payer: Self-pay

## 2021-03-30 DIAGNOSIS — R1032 Left lower quadrant pain: Secondary | ICD-10-CM | POA: Insufficient documentation

## 2021-03-30 HISTORY — DX: Essential (primary) hypertension: I10

## 2021-03-30 MED ORDER — IOHEXOL 300 MG/ML  SOLN
100.0000 mL | Freq: Once | INTRAMUSCULAR | Status: AC | PRN
  Administered 2021-03-30: 100 mL via INTRAVENOUS

## 2021-05-04 ENCOUNTER — Ambulatory Visit: Payer: 59 | Admitting: Dermatology

## 2021-05-21 ENCOUNTER — Ambulatory Visit (INDEPENDENT_AMBULATORY_CARE_PROVIDER_SITE_OTHER): Payer: BC Managed Care – PPO | Admitting: Dermatology

## 2021-05-21 ENCOUNTER — Other Ambulatory Visit: Payer: Self-pay

## 2021-05-21 DIAGNOSIS — L814 Other melanin hyperpigmentation: Secondary | ICD-10-CM | POA: Diagnosis not present

## 2021-05-21 DIAGNOSIS — L409 Psoriasis, unspecified: Secondary | ICD-10-CM

## 2021-05-21 DIAGNOSIS — Z86018 Personal history of other benign neoplasm: Secondary | ICD-10-CM

## 2021-05-21 DIAGNOSIS — L578 Other skin changes due to chronic exposure to nonionizing radiation: Secondary | ICD-10-CM

## 2021-05-21 DIAGNOSIS — L719 Rosacea, unspecified: Secondary | ICD-10-CM

## 2021-05-21 DIAGNOSIS — D1800 Hemangioma unspecified site: Secondary | ICD-10-CM

## 2021-05-21 NOTE — Progress Notes (Signed)
Follow-Up Visit   Subjective  Henry Wu is a 45 y.o. male who presents for the following: Psoriasis (Patient here today for 6 month psoriasis follow up. He is currently on Taltz and not using any topicals. Patient feels that psoriasis is controlled, no areas that are flared today. ).  Patient had positive TB test 10/2020 and saw Dr. Delaine Lame January 2022. Patient took rifampin from January to May.   Patient had TBSE last November, history of dysplastic nevi.   The following portions of the chart were reviewed this encounter and updated as appropriate:   Tobacco  Allergies  Meds  Problems  Med Hx  Surg Hx  Fam Hx      Review of Systems:  No other skin or systemic complaints except as noted in HPI or Assessment and Plan.  Objective  Well appearing patient in no apparent distress; mood and affect are within normal limits.  A focused examination was performed including face, neck, chest and back and legs. Relevant physical exam findings are noted in the Assessment and Plan.  bilateral legs and arms Small guttate plaques at extremities  Face Erythema    Assessment & Plan  Psoriasis -severe but improved on current treatment with systemic biologic Taltz injections. bilateral legs and arms Reviewed risks of biologics including immunosuppression, infections, injection site reaction, and failure to improve condition. Goal is control of skin condition, not cure.  Some older biologics such as Humira and Enbrel may slightly increase risk of malignancy and may worsen congestive heart failure. The use of biologics requires long term medication management, including periodic office visits and monitoring of blood work.  Going forward patient will not need TB tests for years but will need chest x-ray in November 2022.  Related Medications mometasone (ELOCON) 0.1 % cream Apply 1 application topically daily.  Ixekizumab (TALTZ) 80 MG/ML SOAJ Inject 1 pen sq every 4  weeks  Rosacea Face Discussed treatment with laser vs topicals to reduce redness temporarily.  Rosacea is a chronic progressive skin condition usually affecting the face of adults, causing redness and/or acne bumps. It is treatable but not curable. It sometimes affects the eyes (ocular rosacea) as well. It may respond to topical and/or systemic medication and can flare with stress, sun exposure, alcohol, exercise and some foods.  Daily application of broad spectrum spf 30+ sunscreen to face is recommended to reduce flares.  Lentigines - Scattered tan macules - Due to sun exposure - Benign-appering, observe - Recommend daily broad spectrum sunscreen SPF 30+ to sun-exposed areas, reapply every 2 hours as needed. - Call for any changes  Actinic Damage - chronic, secondary to cumulative UV radiation exposure/sun exposure over time - diffuse scaly erythematous macules with underlying dyspigmentation - Recommend daily broad spectrum sunscreen SPF 30+ to sun-exposed areas, reapply every 2 hours as needed.  - Recommend staying in the shade or wearing long sleeves, sun glasses (UVA+UVB protection) and wide brim hats (4-inch brim around the entire circumference of the hat). - Call for new or changing lesions.  Hemangiomas - Red papules - Discussed benign nature - Observe - Call for any changes  History of Dysplastic Nevi - No evidence of recurrence today - Recommend regular full body skin exams - Recommend daily broad spectrum sunscreen SPF 30+ to sun-exposed areas, reapply every 2 hours as needed.  - Call if any new or changing lesions are noted between office visits  Return in about 6 months (around 11/20/2021) for TBSE, Psoriasis.  Graciella Belton, RMA,  am acting as scribe for Sarina Ser, MD . Documentation: I have reviewed the above documentation for accuracy and completeness, and I agree with the above.  Sarina Ser, MD

## 2021-05-21 NOTE — Patient Instructions (Signed)
Reviewed risks of biologics including immunosuppression, infections, injection site reaction, and failure to improve condition. Goal is control of skin condition, not cure.  Some older biologics such as Humira and Enbrel may slightly increase risk of malignancy and may worsen congestive heart failure. The use of biologics requires long term medication management, including periodic office visits and monitoring of blood work.   

## 2021-05-25 ENCOUNTER — Encounter: Payer: Self-pay | Admitting: Dermatology

## 2021-05-27 ENCOUNTER — Ambulatory Visit: Payer: 59 | Admitting: Dermatology

## 2021-06-01 ENCOUNTER — Telehealth: Payer: Self-pay

## 2021-06-01 NOTE — Telephone Encounter (Signed)
Incoming call report critical

## 2021-06-01 NOTE — Telephone Encounter (Signed)
Patient has rescheduled his July appointment. He will come in on July 19 but wants to have labs drawn on July 7 at a [facility] near home. I have that info and as soon as labs are ordered I will hold them and fax that morning. Please order appropriate labs and let me know when they are in system so I can make note and print the orders for July 7 lab faxing.

## 2021-06-10 ENCOUNTER — Other Ambulatory Visit: Payer: Self-pay | Admitting: Dermatology

## 2021-06-10 DIAGNOSIS — L409 Psoriasis, unspecified: Secondary | ICD-10-CM

## 2021-06-14 ENCOUNTER — Other Ambulatory Visit: Payer: Self-pay | Admitting: Dermatology

## 2021-06-14 DIAGNOSIS — L409 Psoriasis, unspecified: Secondary | ICD-10-CM

## 2021-06-15 ENCOUNTER — Ambulatory Visit: Payer: 59 | Admitting: Infectious Diseases

## 2021-06-17 ENCOUNTER — Telehealth: Payer: Self-pay

## 2021-06-17 ENCOUNTER — Other Ambulatory Visit: Payer: Self-pay | Admitting: Infectious Diseases

## 2021-06-17 DIAGNOSIS — Z227 Latent tuberculosis: Secondary | ICD-10-CM

## 2021-06-17 NOTE — Telephone Encounter (Signed)
PAtient will be going to [facility] on [address] at noon today. Requesting labs to check kidneys and liver function.

## 2021-06-18 LAB — HEPATIC FUNCTION PANEL
ALT: 52 IU/L — ABNORMAL HIGH (ref 0–44)
AST: 22 IU/L (ref 0–40)
Albumin: 4 g/dL (ref 4.0–5.0)
Alkaline Phosphatase: 54 IU/L (ref 44–121)
Bilirubin Total: 0.6 mg/dL (ref 0.0–1.2)
Bilirubin, Direct: 0.14 mg/dL (ref 0.00–0.40)
Total Protein: 6.4 g/dL (ref 6.0–8.5)

## 2021-06-22 ENCOUNTER — Telehealth: Payer: Self-pay

## 2021-06-22 NOTE — Telephone Encounter (Signed)
Advised labs good after treatment. Patient acknowledged and did not have any concerns.

## 2021-06-29 ENCOUNTER — Ambulatory Visit: Payer: BC Managed Care – PPO | Admitting: Infectious Diseases

## 2021-10-21 ENCOUNTER — Ambulatory Visit: Payer: 59 | Admitting: Dermatology

## 2021-10-22 ENCOUNTER — Encounter: Payer: Self-pay | Admitting: Dermatology

## 2021-11-26 ENCOUNTER — Ambulatory Visit
Admission: RE | Admit: 2021-11-26 | Discharge: 2021-11-26 | Disposition: A | Discharge status: Home / Self Care | Payer: BC Managed Care – PPO | Source: Ambulatory Visit | Attending: Gastroenterology | Admitting: Gastroenterology

## 2021-11-26 ENCOUNTER — Other Ambulatory Visit: Payer: Self-pay | Admitting: Gastroenterology

## 2021-11-26 ENCOUNTER — Other Ambulatory Visit: Payer: Self-pay

## 2021-11-26 DIAGNOSIS — K5730 Diverticulosis of large intestine without perforation or abscess without bleeding: Secondary | ICD-10-CM

## 2021-11-26 DIAGNOSIS — R1032 Left lower quadrant pain: Secondary | ICD-10-CM

## 2021-11-26 DIAGNOSIS — K5732 Diverticulitis of large intestine without perforation or abscess without bleeding: Secondary | ICD-10-CM

## 2021-11-26 MED ORDER — IOHEXOL 300 MG/ML  SOLN
100.0000 mL | Freq: Once | INTRAMUSCULAR | Status: AC | PRN
  Administered 2021-11-26: 100 mL via INTRAVENOUS

## 2021-12-01 ENCOUNTER — Ambulatory Visit: Payer: BC Managed Care – PPO | Admitting: Dermatology

## 2021-12-23 ENCOUNTER — Telehealth: Payer: Self-pay

## 2021-12-23 ENCOUNTER — Other Ambulatory Visit: Payer: Self-pay | Admitting: Dermatology

## 2021-12-23 ENCOUNTER — Ambulatory Visit (INDEPENDENT_AMBULATORY_CARE_PROVIDER_SITE_OTHER): Payer: BC Managed Care – PPO | Admitting: Dermatology

## 2021-12-23 ENCOUNTER — Ambulatory Visit
Admission: RE | Admit: 2021-12-23 | Discharge: 2021-12-23 | Disposition: A | Discharge status: Home / Self Care | Payer: BC Managed Care – PPO | Source: Ambulatory Visit | Attending: Dermatology | Admitting: Dermatology

## 2021-12-23 ENCOUNTER — Other Ambulatory Visit: Payer: Self-pay

## 2021-12-23 ENCOUNTER — Ambulatory Visit
Admission: RE | Admit: 2021-12-23 | Discharge: 2021-12-23 | Disposition: A | Discharge status: Home / Self Care | Payer: BC Managed Care – PPO | Attending: Dermatology | Admitting: Dermatology

## 2021-12-23 DIAGNOSIS — L853 Xerosis cutis: Secondary | ICD-10-CM

## 2021-12-23 DIAGNOSIS — L409 Psoriasis, unspecified: Secondary | ICD-10-CM

## 2021-12-23 MED ORDER — MOMETASONE FUROATE 0.1 % EX CREA: 1.0000 "application " | TOPICAL_CREAM | Freq: Every day | CUTANEOUS | 3 refills | Status: DC

## 2021-12-23 MED ORDER — TALTZ 80 MG/ML ~~LOC~~ SOAJ: SUBCUTANEOUS | 4 refills | Status: DC

## 2021-12-23 NOTE — Telephone Encounter (Signed)
Advised patient chest xreay is ok and he can continue Taltz/hd

## 2021-12-23 NOTE — Progress Notes (Signed)
° °  Follow-Up Visit   Subjective  Henry Wu is a 46 y.o. male who presents for the following: Follow-up (Patient here today for 6 month psoriasis follow up. Patient is currently using taltz. ). In the interim, he had positive TB test and was treated for TB by infectious disease.  Infectious disease felt it was fine for him to continue with Taltz.  The following portions of the chart were reviewed this encounter and updated as appropriate:  Tobacco   Allergies   Meds   Problems   Med Hx   Surg Hx   Fam Hx      Review of Systems: No other skin or systemic complaints except as noted in HPI or Assessment and Plan.  Objective  Well appearing patient in no apparent distress; mood and affect are within normal limits.  A focused examination was performed including elbows, arms, lower legs, knees. Relevant physical exam findings are noted in the Assessment and Plan.  arms, elbows, legs, knees Elbows and arms are clear at exam, knees, and legs are clear    Assessment & Plan  Psoriasis arms, elbows, legs, knees  Psoriasis is a chronic non-curable, but treatable genetic/hereditary disease that may have other systemic features affecting other organ systems such as joints (Psoriatic Arthritis). It is associated with an increased risk of inflammatory bowel disease, heart disease, non-alcoholic fatty liver disease, and depression.    Reviewed recent lab work  elevated wbc - patient has diverticulitis  Patient has history of positive tb  Ordered Chest x- ray, required to be checked yearly  Continue Taltz 80 mg / ml soaj  - inject contents of pen under skin every 4 weeks Continue Mometasone 0.1 % cream to affected areas daily prn  Topical steroids (such as triamcinolone, fluocinolone, fluocinonide, mometasone, clobetasol, halobetasol, betamethasone, hydrocortisone) can cause thinning and lightening of the skin if they are used for too long in the same area. Your physician has selected the right  strength medicine for your problem and area affected on the body. Please use your medication only as directed by your physician to prevent side effects.    DG Chest 2 View - arms, elbows, legs, knees  Related Medications Ixekizumab (TALTZ) 80 MG/ML SOAJ INJECT THE CONTENTS OF 1 PEN UNDER THE SKIN EVERY 4 WEEKS  mometasone (ELOCON) 0.1 % cream Apply 1 application topically daily.  Xerosis - diffuse xerotic patches - recommend gentle, hydrating skin care - gentle skin care handout given  Return for 6 month tbse psoriasis follow up and total-body skin exam for skin cancer screening.  Advised that since he is on him mild immunosuppressant we would recommend doing yearly's general skin cancer screenings.  He has history of dysplastic nevi also. He declines total-body skin exam today . Henry Wu, CMA, am acting as scribe for Henry Ser, MD. Documentation: I have reviewed the above documentation for accuracy and completeness, and I agree with the above.  Henry Ser, MD

## 2021-12-23 NOTE — Patient Instructions (Addendum)
Gentle Skin Care Guide  1. Bathe no more than once a day.  2. Avoid bathing in hot water  3. Use a mild soap like Dove, Vanicream, Cetaphil, CeraVe. Can use Lever 2000 or Cetaphil antibacterial soap  4. Use soap only where you need it. On most days, use it under your arms, between your legs, and on your feet. Let the water rinse other areas unless visibly dirty.  5. When you get out of the bath/shower, use a towel to gently blot your skin dry, don't rub it.  6. While your skin is still a little damp, apply a moisturizing cream such as Vanicream, CeraVe, Cetaphil, Eucerin, Sarna lotion or plain Vaseline Jelly. For hands apply Neutrogena [place] Hand Cream or Excipial Hand Cream.  7. Reapply moisturizer any time you start to itch or feel dry.  8. Sometimes using free and clear laundry detergents can be helpful. Fabric softener sheets should be avoided. Downy Free & Gentle liquid, or any liquid fabric softener that is free of dyes and perfumes, it acceptable to use  9. If your doctor has given you prescription creams you may apply moisturizers over them    If You Need Anything After Your Visit  If you have any questions or concerns for your doctor, please call our main line at [phone] and press option 4 to reach your doctor's medical assistant. If no one answers, please leave a voicemail as directed and we will return your call as soon as possible. Messages left after 4 pm will be answered the following business day.   You may also send [place] a message via [place]. We typically respond to MyChart messages within 1-2 business days.  For prescription refills, please ask your pharmacy to contact our office. Our fax number is [phone].  If you have an urgent issue when the clinic is closed that cannot wait until the next business day, you can page your doctor at the number below.    Please note that while we do our best to be available for urgent issues outside of office hours, we are  not available 24/7.   If you have an urgent issue and are unable to reach [place], you may choose to seek medical care at your doctor's office, retail clinic, urgent care center, or emergency room.  If you have a medical emergency, please immediately call 911 or go to the emergency department.  Pager Numbers  - Dr. Nehemiah Massed: [phone]  - Dr. Laurence Ferrari: [phone]  - Dr. Nicole Kindred: [phone]  In the event of inclement weather, please call our main line at [phone] for an update on the status of any delays or closures.  Dermatology Medication Tips: Please keep the boxes that topical medications come in in order to help keep track of the instructions about where and how to use these. Pharmacies typically print the medication instructions only on the boxes and not directly on the medication tubes.   If your medication is too expensive, please contact our office at [phone] option 4 or send [place] a message through [place].   We are unable to tell what your co-pay for medications will be in advance as this is different depending on your insurance coverage. However, we may be able to find a substitute medication at lower cost or fill out paperwork to get insurance to cover a needed medication.   If a prior authorization is required to get your medication covered by your insurance company, please allow [place] 1-2 business days to complete this  process.  Drug prices often vary depending on where the prescription is filled and some pharmacies may offer cheaper prices.  The website www.goodrx.com contains coupons for medications through different pharmacies. The prices here do not account for what the cost may be with help from insurance (it may be cheaper with your insurance), but the website can give you the price if you did not use any insurance.  - You can print the associated coupon and take it with your prescription to the pharmacy.  - You may also stop by our office during regular business  hours and pick up a GoodRx coupon card.  - If you need your prescription sent electronically to a different pharmacy, notify our office through [facility] or by phone at [phone] option 4.     Si Usted Necesita Algo Despus de Su Visita  Tambin puede enviarnos un mensaje a travs de Pharmacist, community. Por lo general respondemos a los mensajes de MyChart en el transcurso de 1 a 2 das hbiles.  Para renovar recetas, por favor pida a su farmacia que se ponga en contacto con nuestra oficina. Harland Dingwall de fax es [place] [phone].  Si tiene un asunto urgente cuando la clnica est cerrada y que no puede esperar hasta el siguiente da hbil, puede llamar/localizar a su doctor(a) al nmero que aparece a continuacin.   Por favor, tenga en cuenta que aunque hacemos todo lo posible para estar disponibles para asuntos urgentes fuera del horario de [place], no estamos disponibles las 24 horas del da, los 7 das de la [place].   Si tiene un problema urgente y no puede comunicarse con nosotros, puede optar por buscar atencin mdica  en el consultorio de su doctor(a), en una clnica privada, en un centro de atencin urgente o en una sala de emergencias.  Si tiene Engineering geologist, por favor llame inmediatamente al 911 o vaya a la sala de emergencias.  Nmeros de bper  - Dr. Nehemiah Massed: [phone]  - Dra. Moye: [phone]  - Dra. Nicole Kindred: [phone]  En caso de inclemencias del [place], por favor llame a Johnsie Kindred principal al [phone] para una actualizacin sobre el [place] de cualquier retraso o cierre.  Consejos para la medicacin en dermatologa: Por favor, guarde las cajas en las que vienen los medicamentos de uso tpico para ayudarle a seguir las instrucciones sobre dnde y cmo usarlos. Las farmacias generalmente imprimen las instrucciones del medicamento slo en las cajas y no directamente en los tubos del [place].   Si su medicamento es muy caro, por favor,  pngase en contacto con Zigmund Daniel llamando al [phone] y presione la opcin 4 o envenos un mensaje a travs de Pharmacist, community.   No podemos decirle cul ser su copago por los medicamentos por adelantado ya que esto es diferente dependiendo de la cobertura de su seguro. Sin embargo, es posible que podamos encontrar un medicamento sustituto a Electrical engineer un formulario para que el seguro cubra el medicamento que se considera necesario.   Si se requiere una autorizacin previa para que su compaa de seguros [place] su medicamento, por favor permtanos de 1 a 2 das hbiles para completar este proceso.  Los precios de los medicamentos varan con frecuencia dependiendo del Environmental consultant de dnde se surte la receta y alguna farmacias pueden ofrecer precios ms baratos.  El sitio web www.goodrx.com tiene cupones para medicamentos de Airline pilot. Los precios aqu no tienen en cuenta lo que podra costar con la ayuda del seguro (puede  ser ms barato con su seguro), pero el sitio web puede darle el precio si no Field seismologist.  - Puede imprimir el cupn correspondiente y llevarlo con su receta a la farmacia.  - Tambin puede pasar por nuestra oficina durante el horario de atencin regular y Charity fundraiser una tarjeta de cupones de GoodRx.  - Si necesita que su receta se enve electrnicamente a una farmacia diferente, informe a nuestra oficina a travs de MyChart de  o por telfono llamando al [phone] y presione la opcin 4.

## 2021-12-23 NOTE — Telephone Encounter (Signed)
-----   Message from Ralene Bathe, MD sent at 12/23/2021 10:06 AM EST ----- Chest X-ray from 12/23/2021 is OK and clear. (History of TB - treated) Pt currently on Taltz for Psoriasis. Continue Taltz and keep 6 mos follow up appt.

## 2021-12-27 ENCOUNTER — Other Ambulatory Visit: Payer: Self-pay | Admitting: Dermatology

## 2021-12-27 ENCOUNTER — Encounter: Payer: Self-pay | Admitting: Dermatology

## 2021-12-27 DIAGNOSIS — L409 Psoriasis, unspecified: Secondary | ICD-10-CM

## 2022-02-07 ENCOUNTER — Emergency Department
Admission: EM | Admit: 2022-02-07 | Discharge: 2022-02-07 | Disposition: A | Discharge status: Home / Self Care | Payer: BC Managed Care – PPO | Attending: Emergency Medicine | Admitting: Emergency Medicine

## 2022-02-07 ENCOUNTER — Emergency Department: Payer: BC Managed Care – PPO

## 2022-02-07 ENCOUNTER — Other Ambulatory Visit: Payer: Self-pay

## 2022-02-07 DIAGNOSIS — M549 Dorsalgia, unspecified: Secondary | ICD-10-CM | POA: Insufficient documentation

## 2022-02-07 DIAGNOSIS — R109 Unspecified abdominal pain: Secondary | ICD-10-CM | POA: Insufficient documentation

## 2022-02-07 DIAGNOSIS — G8918 Other acute postprocedural pain: Secondary | ICD-10-CM | POA: Insufficient documentation

## 2022-02-07 DIAGNOSIS — R0789 Other chest pain: Secondary | ICD-10-CM | POA: Diagnosis not present

## 2022-02-07 LAB — URINALYSIS, ROUTINE W REFLEX MICROSCOPIC
Bacteria, UA: NONE SEEN
Bilirubin Urine: NEGATIVE
Glucose, UA: NEGATIVE mg/dL
Ketones, ur: NEGATIVE mg/dL
Leukocytes,Ua: NEGATIVE
Nitrite: NEGATIVE
Protein, ur: NEGATIVE mg/dL
Specific Gravity, Urine: >1.046 — ABNORMAL HIGH (ref 1.005–1.030)
pH: 5 (ref 5.0–8.0)

## 2022-02-07 LAB — CBC WITH DIFFERENTIAL/PLATELET
Abs Immature Granulocytes: 0.07 10*3/uL (ref 0.00–0.07)
Basophils Absolute: 0.1 10*3/uL (ref 0.0–0.1)
Basophils Relative: 1 %
Eosinophils Absolute: 0.2 10*3/uL (ref 0.0–0.5)
Eosinophils Relative: 2 %
HCT: 37.2 % — ABNORMAL LOW (ref 39.0–52.0)
Hemoglobin: 12.5 g/dL — ABNORMAL LOW (ref 13.0–17.0)
Immature Granulocytes: 1 %
Lymphocytes Relative: 18 %
Lymphs Abs: 1.8 10*3/uL (ref 0.7–4.0)
MCH: 30.3 pg (ref 26.0–34.0)
MCHC: 33.6 g/dL (ref 30.0–36.0)
MCV: 90.1 fL (ref 80.0–100.0)
Monocytes Absolute: 0.7 10*3/uL (ref 0.1–1.0)
Monocytes Relative: 7 %
Neutro Abs: 6.8 10*3/uL (ref 1.7–7.7)
Neutrophils Relative %: 71 %
Platelets: 368 10*3/uL (ref 150–400)
RBC: 4.13 MIL/uL — ABNORMAL LOW (ref 4.22–5.81)
RDW: 13.2 % (ref 11.5–15.5)
WBC: 9.5 10*3/uL (ref 4.0–10.5)
nRBC: 0 % (ref 0.0–0.2)

## 2022-02-07 LAB — COMPREHENSIVE METABOLIC PANEL
ALT: 50 U/L — ABNORMAL HIGH (ref 0–44)
AST: 22 U/L (ref 15–41)
Albumin: 3.7 g/dL (ref 3.5–5.0)
Alkaline Phosphatase: 53 U/L (ref 38–126)
Anion gap: 7 (ref 5–15)
BUN: 19 mg/dL (ref 6–20)
CO2: 27 mmol/L (ref 22–32)
Calcium: 9.2 mg/dL (ref 8.9–10.3)
Chloride: 103 mmol/L (ref 98–111)
Creatinine, Ser: 1.14 mg/dL (ref 0.61–1.24)
GFR, Estimated: >60 mL/min (ref >60)
Glucose, Bld: 95 mg/dL (ref 70–99)
Potassium: 4.5 mmol/L (ref 3.5–5.1)
Sodium: 137 mmol/L (ref 135–145)
Total Bilirubin: 0.5 mg/dL (ref 0.3–1.2)
Total Protein: 7.7 g/dL (ref 6.5–8.1)

## 2022-02-07 LAB — LIPASE, BLOOD: Lipase: 34 U/L (ref 11–51)

## 2022-02-07 LAB — TROPONIN I (HIGH SENSITIVITY): Troponin I (High Sensitivity): <2 ng/L (ref <18)

## 2022-02-07 MED ORDER — MORPHINE SULFATE (PF) 4 MG/ML IV SOLN
4.0000 mg | Freq: Once | INTRAVENOUS | Status: AC
  Administered 2022-02-07: 4 mg via INTRAVENOUS
  Filled 2022-02-07: qty 1

## 2022-02-07 MED ORDER — IOHEXOL 350 MG/ML SOLN
100.0000 mL | Freq: Once | INTRAVENOUS | Status: AC | PRN
  Administered 2022-02-07: 100 mL via INTRAVENOUS
  Filled 2022-02-07: qty 100

## 2022-02-07 NOTE — ED Notes (Signed)
Urine sample sent to lab

## 2022-02-07 NOTE — ED Notes (Signed)
[facility]  HOSPITAL  CALLED  PER  DR  Creig Hines  MD

## 2022-02-07 NOTE — ED Notes (Signed)
See triage note. Pt to ED complaining of lower L back pain since this morning and L side, rib pain that is "catching" and painful when takes deep breath. Back pain is also worse with frrp breathing. Pt appears to be grimacing in pain. Staets pain is also severe when he lays supine.  Pt had colon resection on 2/14.

## 2022-02-07 NOTE — ED Notes (Signed)
Xray  [facility]  with  [facility]

## 2022-02-07 NOTE — ED Provider Notes (Signed)
I assumed care of this patient approximately 1500.  In brief patient presents for assessment of some acute on subacute pain in the left lower back and abdomen in the setting of some postop pain from recent colon resection on February 14.  He is hemodynamically stable and afebrile on arrival.  No new fevers vomiting or diarrhea per patient.  He is moving his bowels.  Plan is to follow-up CT chest abdomen pelvis.  CBC and CMP are unremarkable.  UA obtained has a blood but otherwise no evidence of infection.  CTA chest abdomen pelvis shows CT chest shows no evidence of PE and known left-sided pleural effusion without any other clear acute process.  CT abdomen pelvis shows interval sigmoid colon resection with anastomosis and small locules of air in the mesocolonic fat adjacent to the anastomosis with some stranding around it concerning for possible anastomotic dehiscence.  No overt pneumoperitoneum.  Small left nephrolithiasis but no ureteral stones hydronephrosis or other acute abdominal or pelvic process.  I discussed this with colorectal provider at [place] who compared these images to once obtained at discharge and felt that given stable vitals with otherwise reassuring labs and patient that his bowels with low suspicion for acute dehiscence of palpation was stable for discharge with outpatient follow-up tomorrow.  I discussed with patient and offered this option versus possible transfer the patient strongly prefers to go home.  I think this is reasonable.  Discharged in stable condition.  Strict return precautions advised and discussed.   Lucrezia Starch, MD 02/07/22 Drema Halon

## 2022-02-07 NOTE — ED Triage Notes (Addendum)
Pt comes with c/o post op problem. Pt states he had part of his colon removed on February 14th at [facility].   Pt states on Friday night he started to have left sided rib pain. Pt states this am he woke up with back pain to left side.  Pt denies any SOb or CP. Pt states his surgeon advised him to come get checked out and rule out PE.

## 2022-02-18 NOTE — ED Provider Notes (Signed)
[facility] Provider Note    Event Date/Time   First MD Initiated Contact with Patient 02/07/22 1534     (approximate)   History   Post-op Problem   HPI  Henry Wu is a 46 y.o. male who recently had colon surgery approximately 2 weeks prior to arrival.  He developed left-sided chest discomfort and back pain 2 to 3 days ago.  He discussed with his surgeon who advised him to come for rule out PE.  He reports abdominal incision has been healing well and does not describe significant abdominal pain.  He denies significant shortness of breath.  No fevers or chills     Physical Exam   Triage Vital Signs: ED Triage Vitals  Enc Vitals Group     BP 02/07/22 1423 119/78     Pulse Rate 02/07/22 1423 75     Resp 02/07/22 1423 18     Temp 02/07/22 1423 98.1 F (36.7 C)     Temp Source 02/07/22 1423 Oral     SpO2 02/07/22 1423 96 %     Weight 02/07/22 1505 127.9 kg (281 lb 15.5 oz)     Height 02/07/22 1505 1.829 m (6')     Head Circumference --      Peak Flow --      Pain Score 02/07/22 1303 10     Pain Loc --      Pain Edu? --      Excl. in [place]? --     Most recent vital signs: Vitals:   02/07/22 1845 02/07/22 1919  BP: 120/76 120/82  Pulse: 70 72  Resp: 18 18  Temp:    SpO2: 96% 99%     General: Awake, no distress.  CV:  Good peripheral perfusion.  No tachycardia Resp:  Normal effort.  Clear to auscultation bilaterally, Abd:  No distention.,  Healing incision, no tenderness to palpation Other:  No calf pain or swelling   ED Results / Procedures / Treatments   Labs (all labs ordered are listed, but only abnormal results are displayed) Labs Reviewed  COMPREHENSIVE METABOLIC PANEL - Abnormal; Notable for the following components:      Result Value   ALT 50 (*)    All other components within normal limits  CBC WITH DIFFERENTIAL/PLATELET - Abnormal; Notable for the following components:   RBC 4.13 (*)    Hemoglobin 12.5 (*)    HCT  37.2 (*)    All other components within normal limits  URINALYSIS, ROUTINE W REFLEX MICROSCOPIC - Abnormal; Notable for the following components:   Color, Urine STRAW (*)    APPearance CLEAR (*)    Specific Gravity, Urine >1.046 (*)    Hgb urine dipstick LARGE (*)    All other components within normal limits  LIPASE, BLOOD  TROPONIN I (HIGH SENSITIVITY)     EKG  ED ECG REPORT I, Lavonia Drafts, the attending physician, personally viewed and interpreted this ECG.  Date: 02/18/2022  Rhythm: normal sinus rhythm QRS Axis: normal Intervals: normal ST/T Wave abnormalities: normal Narrative Interpretation: no evidence of acute ischemia    RADIOLOGY Pending CTA, CT abdomen pelvis    PROCEDURES:  Critical Care performed:   Procedures   MEDICATIONS ORDERED IN ED: Medications  iohexol (OMNIPAQUE) 350 MG/ML injection 100 mL (100 mLs Intravenous Contrast Given 02/07/22 1526)  morphine (PF) 4 MG/ML injection 4 mg (4 mg Intravenous Given 02/07/22 1639)     IMPRESSION / MDM / ASSESSMENT AND  PLAN / ED COURSE  I reviewed the triage vital signs and the nursing notes.   Patient with recent abdominal surgery as described above.  Now with left-sided chest discomfort x2 to 3 days.  No shortness of breath but given recent surgery concern for PE.  Vital signs are reassuring, afebrile  Differential includes pneumonia, musculoskeletal pain, symptoms not consistent with ACS  We will send for CT angiography of the chest and include CT abdomen pelvis  Lab work is reassuring, normal white blood cell count, high sensitive troponin and EKG are unremarkable  I have asked my colleague to follow-up on CT imaging results         FINAL CLINICAL IMPRESSION(S) / ED DIAGNOSES   Final diagnoses:  Post-operative pain     Rx / DC Orders   ED Discharge Orders     None        Note:  This document was prepared using Dragon voice recognition software and may include unintentional  dictation errors.   Lavonia Drafts, MD 02/18/22 [phone]

## 2022-04-14 IMAGING — CR DG CHEST 1V
1 series · 1 of 1 positions shown · non-contrast
Comparison: December 23, 2021

CLINICAL DATA: Rib pain.

EXAM:
CHEST  1 VIEW

[chest pa]
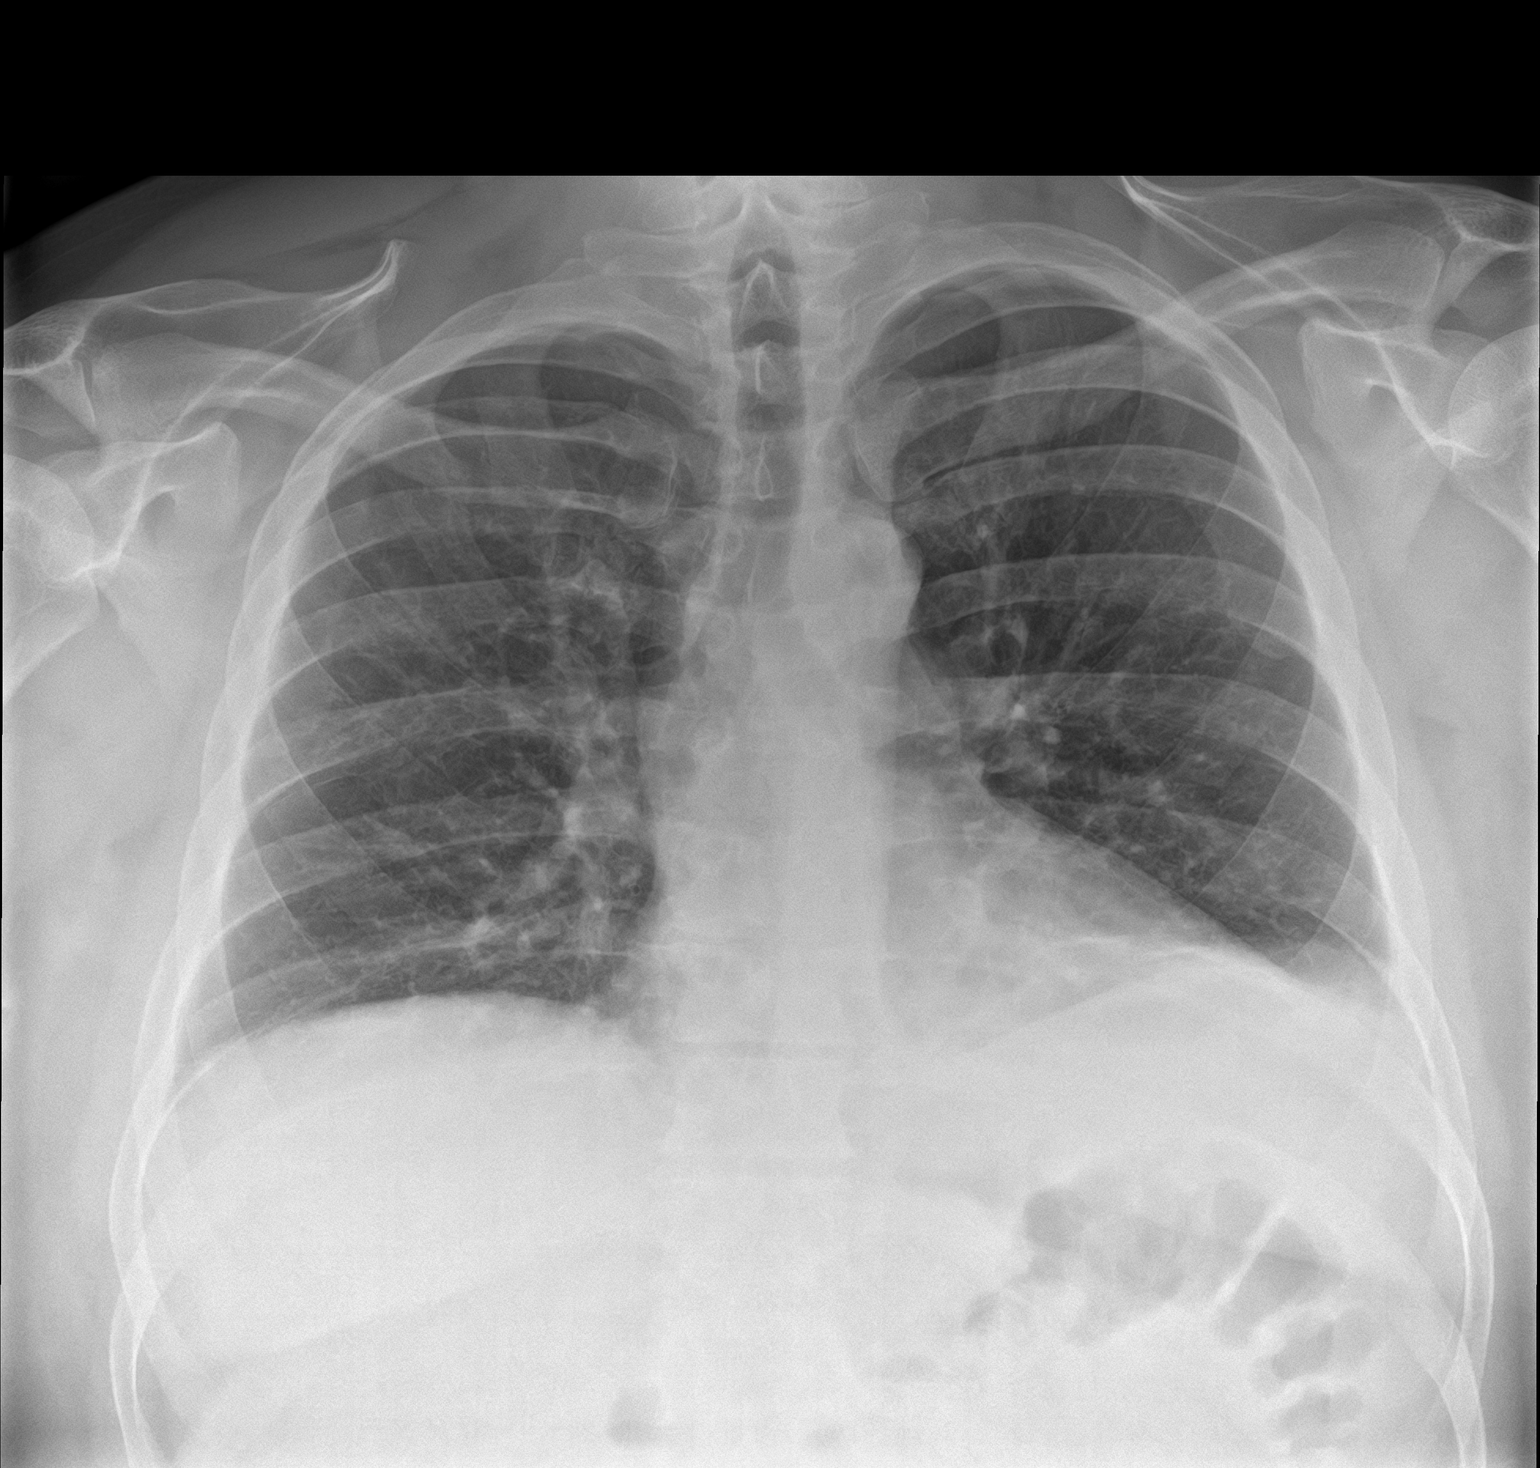

[1 of 1 positions shown; findings below may reference images not displayed]

FINDINGS: There is a small left effusion with underlying opacity, likely
atelectasis. The heart, hila, mediastinum, lungs, and pleura
otherwise unremarkable. Limited views of the ribs are normal.
IMPRESSION: There is a small left effusion with underlying opacity, likely
atelectasis. No other interval changes or acute abnormalities to
explain patient's symptoms.

## 2022-04-14 IMAGING — CT CT ANGIO CHEST
2 of 7 series · 18 of 46 positions shown · IV contrast (APPLIED)
Comparison: None.

CLINICAL DATA: PE suspected, low back pain, left-sided rib pain

EXAM:
CT ANGIOGRAPHY CHEST WITH CONTRAST
TECHNIQUE: Multidetector CT imaging of the chest was performed using the
standard protocol during bolus administration of intravenous
contrast. Multiplanar CT image reconstructions and MIPs were
obtained to evaluate the vascular anatomy.

[Series 5: thins · axial · 0.98mm/px · z∈[-868,-573]mm · 15 of 333 slices shown]
[im 19/333  lung]
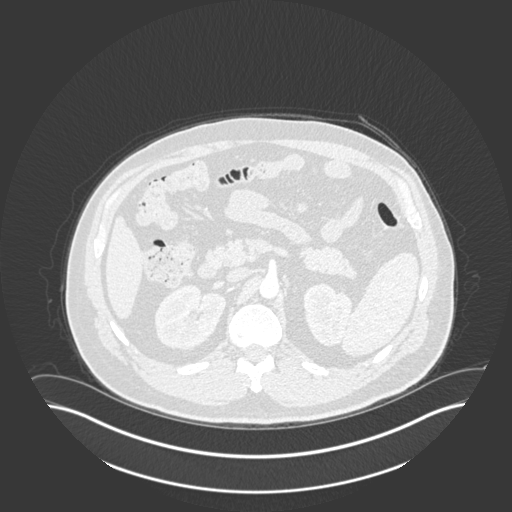
[im 37/333  soft-tissue]
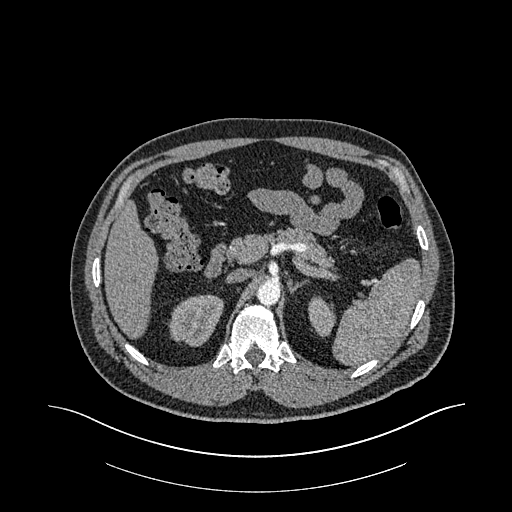
[im 56/333  lung]
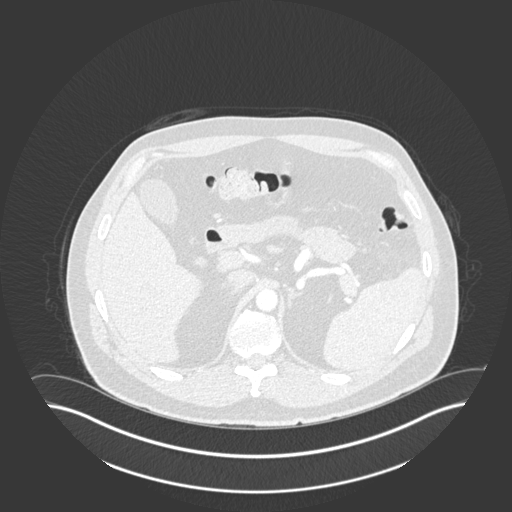
[im 74/333  soft-tissue]
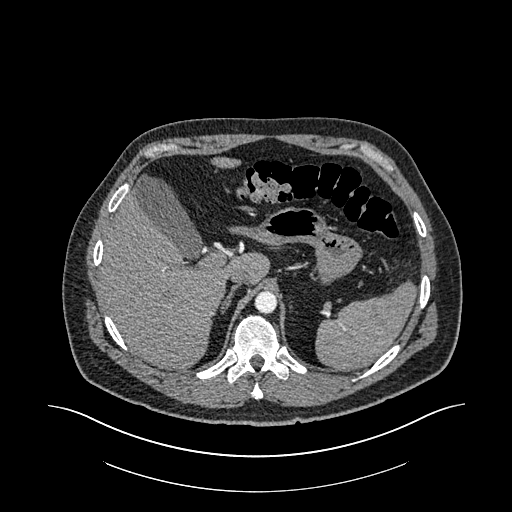
[im 111/333  lung]
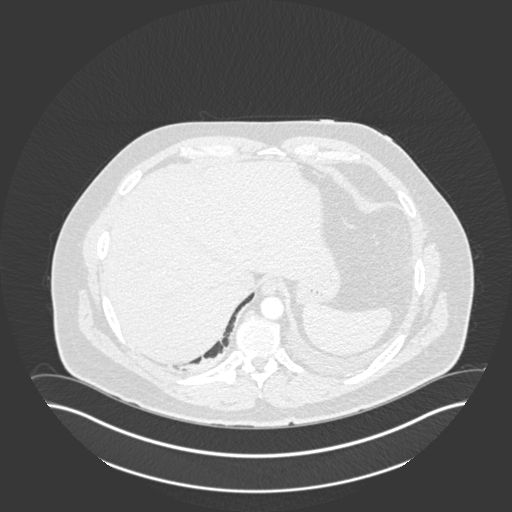
[im 130/333  soft-tissue]
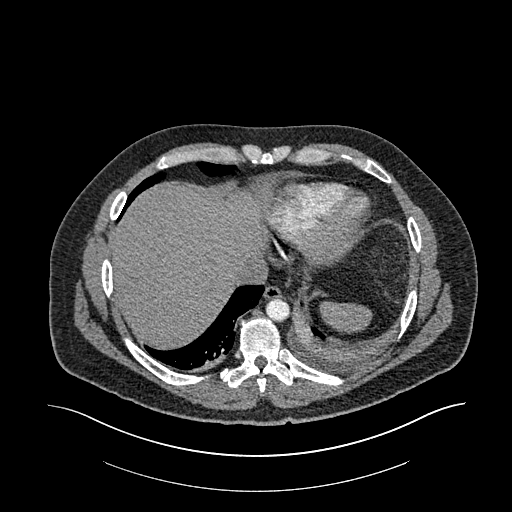
[im 148/333  lung]
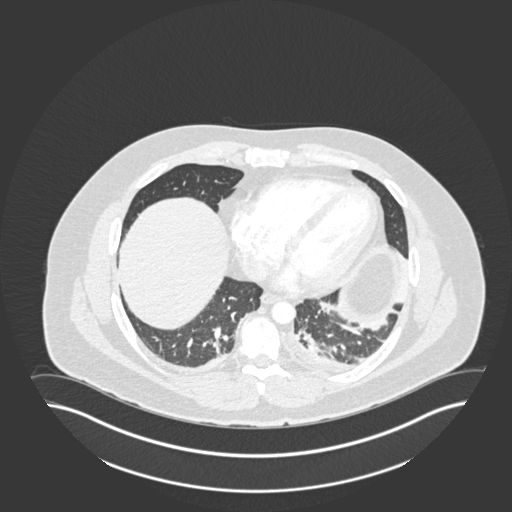
[im 167/333  soft-tissue]
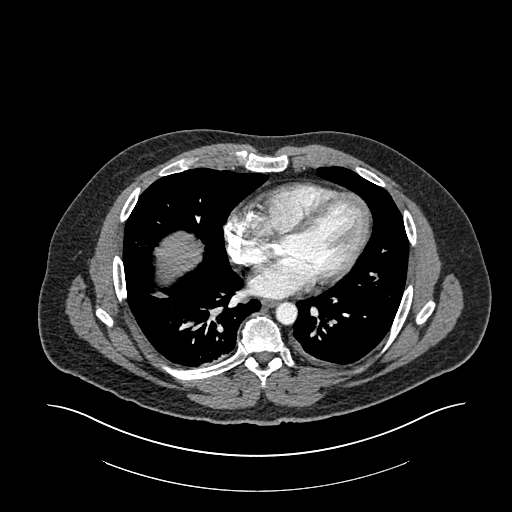
[im 185/333  lung]
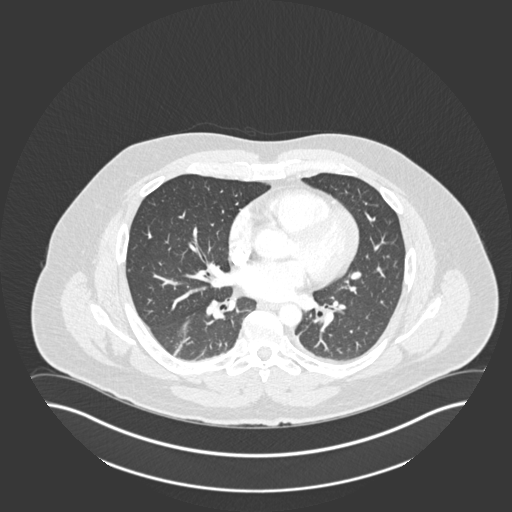
[im 203/333  soft-tissue]
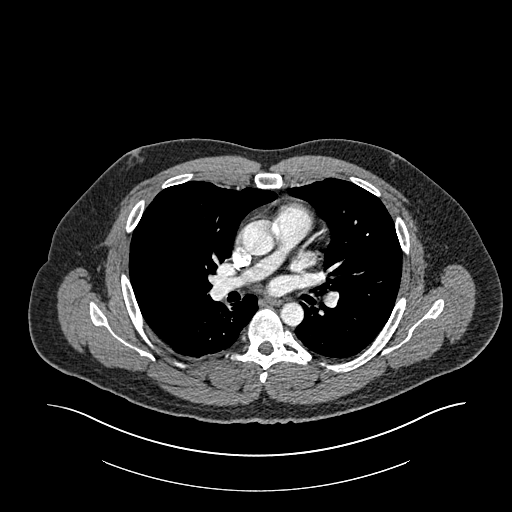
[im 222/333  lung]
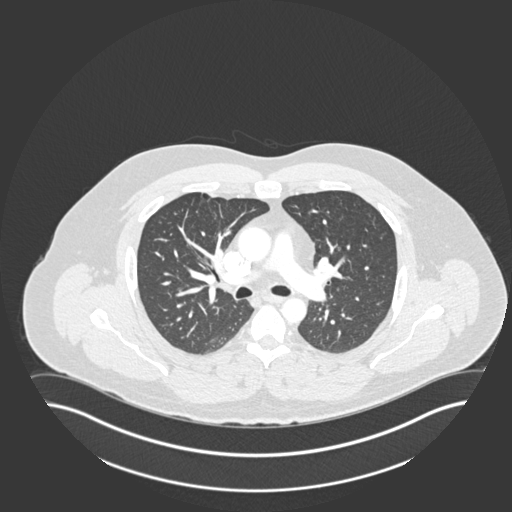
[im 259/333  soft-tissue]
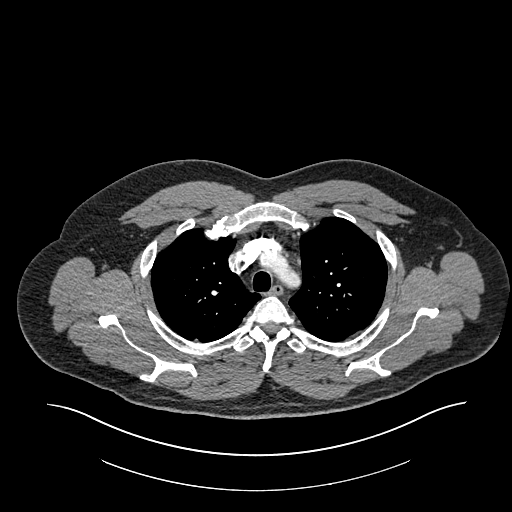
[im 277/333  lung]
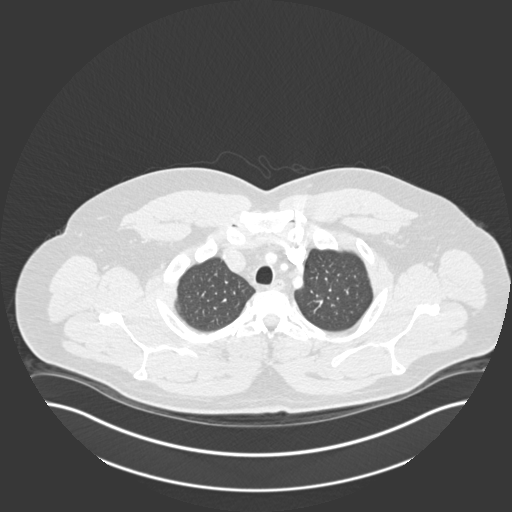
[im 296/333  soft-tissue]
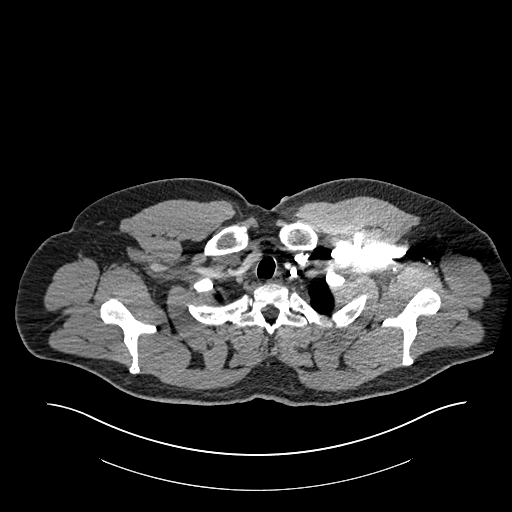
[im 314/333  lung]
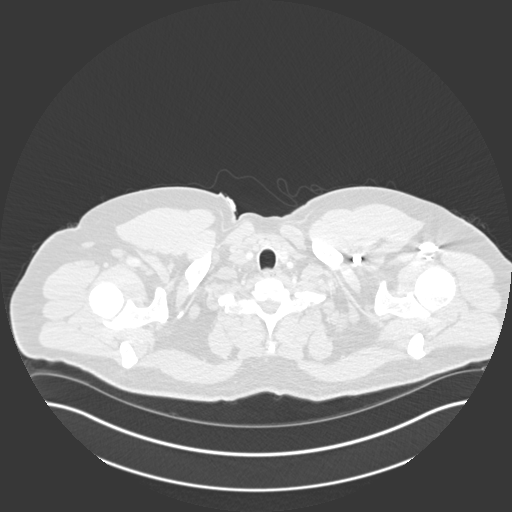

[Series 8: coronal mpr · coronal · 0.68mm/px · 3 of 147 slices shown]
[im 37/147  soft-tissue]
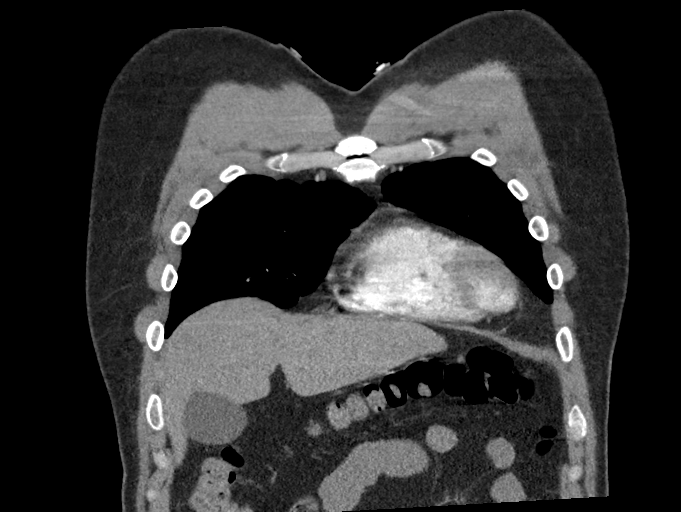
[im 74/147  soft-tissue]
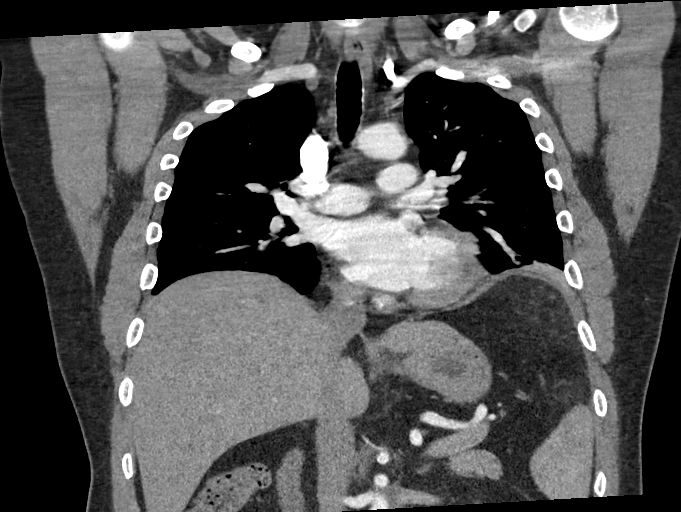
[im 110/147  soft-tissue]
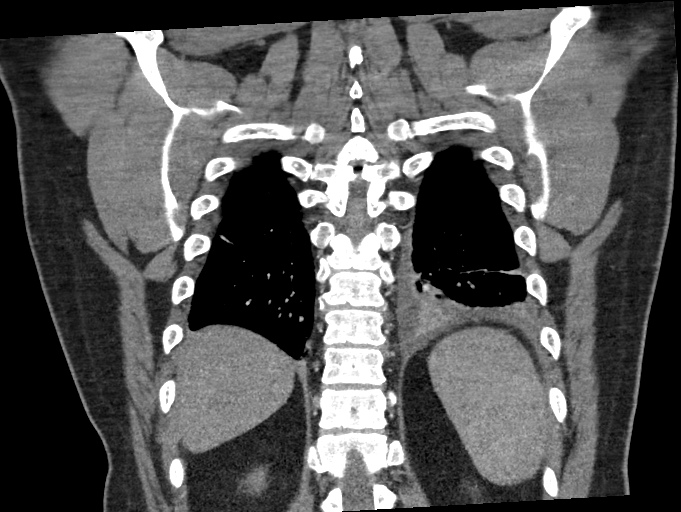

[18 of 46 positions shown; findings below may reference images not displayed]

RADIATION DOSE REDUCTION: This exam was performed according to the
departmental dose-optimization program which includes automated
exposure control, adjustment of the mA and/or kV according to
patient size and/or use of iterative reconstruction technique.

CONTRAST:  100mL OMNIPAQUE IOHEXOL 350 MG/ML SOLN
FINDINGS: Cardiovascular: Satisfactory opacification of the pulmonary arteries
to the segmental level. No evidence of pulmonary embolism. Normal
heart size. No pericardial effusion.

Mediastinum/Nodes: No enlarged mediastinal, hilar, or axillary lymph
nodes. Thyroid gland, trachea, and esophagus demonstrate no
significant findings.

Lungs/Pleura: Small left, trace right pleural effusions and
associated atelectasis or consolidation.

Upper Abdomen: Please see separately reported examination of the
abdomen and pelvis.

Musculoskeletal: No chest wall abnormality. No acute osseous
findings.

Review of the MIP images confirms the above findings.
IMPRESSION: 1. Negative examination for pulmonary embolism.
2. Small left, trace right pleural effusions and associated
atelectasis or consolidation.
3. No fracture or other abnormality of the ribs to explain pain.

## 2022-06-09 ENCOUNTER — Other Ambulatory Visit: Payer: Self-pay

## 2022-06-09 DIAGNOSIS — L409 Psoriasis, unspecified: Secondary | ICD-10-CM

## 2022-06-09 MED ORDER — TALTZ 80 MG/ML ~~LOC~~ SOAJ: SUBCUTANEOUS | 0 refills | Status: DC

## 2022-06-09 NOTE — Progress Notes (Signed)
Patient called needing RF of Taltz until follow up. aw

## 2022-06-28 ENCOUNTER — Ambulatory Visit (INDEPENDENT_AMBULATORY_CARE_PROVIDER_SITE_OTHER): Payer: BC Managed Care – PPO | Admitting: Dermatology

## 2022-06-28 DIAGNOSIS — D1801 Hemangioma of skin and subcutaneous tissue: Secondary | ICD-10-CM

## 2022-06-28 DIAGNOSIS — L905 Scar conditions and fibrosis of skin: Secondary | ICD-10-CM

## 2022-06-28 DIAGNOSIS — Z9889 Other specified postprocedural states: Secondary | ICD-10-CM

## 2022-06-28 DIAGNOSIS — L409 Psoriasis, unspecified: Secondary | ICD-10-CM | POA: Diagnosis not present

## 2022-06-28 DIAGNOSIS — I781 Nevus, non-neoplastic: Secondary | ICD-10-CM

## 2022-06-28 DIAGNOSIS — Z79899 Other long term (current) drug therapy: Secondary | ICD-10-CM | POA: Diagnosis not present

## 2022-06-28 MED ORDER — TALTZ 80 MG/ML ~~LOC~~ SOAJ: SUBCUTANEOUS | 6 refills | Status: DC

## 2022-06-28 MED ORDER — MOMETASONE FUROATE 0.1 % EX CREA: 1.0000 | TOPICAL_CREAM | Freq: Every day | CUTANEOUS | 4 refills | Status: DC

## 2022-06-28 NOTE — Patient Instructions (Addendum)
Serica scar gel   Due to recent changes in healthcare laws, you may see results of your pathology and/or laboratory studies on MyChart before the doctors have had a chance to review them. We understand that in some cases there may be results that are confusing or concerning to you. Please understand that not all results are received at the same time and often the doctors may need to interpret multiple results in order to provide you with the best plan of care or course of treatment. Therefore, we ask that you please give [place] 2 business days to thoroughly review all your results before contacting the office for clarification. Should we see a critical lab result, you will be contacted sooner.   If You Need Anything After Your Visit  If you have any questions or concerns for your doctor, please call our main line at [phone] and press option 4 to reach your doctor's medical assistant. If no one answers, please leave a voicemail as directed and we will return your call as soon as possible. Messages left after 4 pm will be answered the following business day.   You may also send [place] a message via [place]. We typically respond to MyChart messages within 1-2 business days.  For prescription refills, please ask your pharmacy to contact our office. Our fax number is [phone].  If you have an urgent issue when the clinic is closed that cannot wait until the next business day, you can page your doctor at the number below.    Please note that while we do our best to be available for urgent issues outside of office hours, we are not available 24/7.   If you have an urgent issue and are unable to reach [place], you may choose to seek medical care at your doctor's office, retail clinic, urgent care center, or emergency room.  If you have a medical emergency, please immediately call 911 or go to the emergency department.  Pager Numbers  - Dr. Nehemiah Massed: [phone]  - Dr. Laurence Ferrari: [phone]  - Dr.  Nicole Kindred: [phone]  In the event of inclement weather, please call our main line at [phone] for an update on the status of any delays or closures.  Dermatology Medication Tips: Please keep the boxes that topical medications come in in order to help keep track of the instructions about where and how to use these. Pharmacies typically print the medication instructions only on the boxes and not directly on the medication tubes.   If your medication is too expensive, please contact our office at [phone] option 4 or send [place] a message through [place].   We are unable to tell what your co-pay for medications will be in advance as this is different depending on your insurance coverage. However, we may be able to find a substitute medication at lower cost or fill out paperwork to get insurance to cover a needed medication.   If a prior authorization is required to get your medication covered by your insurance company, please allow [place] 1-2 business days to complete this process.  Drug prices often vary depending on where the prescription is filled and some pharmacies may offer cheaper prices.  The website www.goodrx.com contains coupons for medications through different pharmacies. The prices here do not account for what the cost may be with help from insurance (it may be cheaper with your insurance), but the website can give you the price if you did not use any insurance.  - You can print the associated  coupon and take it with your prescription to the pharmacy.  - You may also stop by our office during regular business hours and pick up a GoodRx coupon card.  - If you need your prescription sent electronically to a different pharmacy, notify our office through [facility] or by phone at [phone] option 4.     Si Usted Necesita Algo Despus de Su Visita  Tambin puede enviarnos un mensaje a travs de Pharmacist, community. Por lo general respondemos a los mensajes de MyChart en el transcurso  de 1 a 2 das hbiles.  Para renovar recetas, por favor pida a su farmacia que se ponga en contacto con nuestra oficina. Harland Dingwall de fax es [place] [phone].  Si tiene un asunto urgente cuando la clnica est cerrada y que no puede esperar hasta el siguiente da hbil, puede llamar/localizar a su doctor(a) al nmero que aparece a continuacin.   Por favor, tenga en cuenta que aunque hacemos todo lo posible para estar disponibles para asuntos urgentes fuera del horario de [place], no estamos disponibles las 24 horas del da, los 7 das de la [place].   Si tiene un problema urgente y no puede comunicarse con nosotros, puede optar por buscar atencin mdica  en el consultorio de su doctor(a), en una clnica privada, en un centro de atencin urgente o en una sala de emergencias.  Si tiene Engineering geologist, por favor llame inmediatamente al 911 o vaya a la sala de emergencias.  Nmeros de bper  - Dr. Nehemiah Massed: [phone]  - Dra. Moye: [phone]  - Dra. Nicole Kindred: [phone]  En caso de inclemencias del [place], por favor llame a Johnsie Kindred principal al [phone] para una actualizacin sobre el [place] de cualquier retraso o cierre.  Consejos para la medicacin en dermatologa: Por favor, guarde las cajas en las que vienen los medicamentos de uso tpico para ayudarle a seguir las instrucciones sobre dnde y cmo usarlos. Las farmacias generalmente imprimen las instrucciones del medicamento slo en las cajas y no directamente en los tubos del [place].   Si su medicamento es muy caro, por favor, pngase en contacto con Zigmund Daniel llamando al [phone] y presione la opcin 4 o envenos un mensaje a travs de Pharmacist, community.   No podemos decirle cul ser su copago por los medicamentos por adelantado ya que esto es diferente dependiendo de la cobertura de su seguro. Sin embargo, es posible que podamos encontrar un medicamento sustituto a Electrical engineer un formulario  para que el seguro cubra el medicamento que se considera necesario.   Si se requiere una autorizacin previa para que su compaa de seguros [place] su medicamento, por favor permtanos de 1 a 2 das hbiles para completar este proceso.  Los precios de los medicamentos varan con frecuencia dependiendo del Environmental consultant de dnde se surte la receta y alguna farmacias pueden ofrecer precios ms baratos.  El sitio web www.goodrx.com tiene cupones para medicamentos de Airline pilot. Los precios aqu no tienen en cuenta lo que podra costar con la ayuda del seguro (puede ser ms barato con su seguro), pero el sitio web puede darle el precio si no utiliz Research scientist (physical sciences).  - Puede imprimir el cupn correspondiente y llevarlo con su receta a la farmacia.  - Tambin puede pasar por nuestra oficina durante el horario de atencin regular y Charity fundraiser una tarjeta de cupones de GoodRx.  - Si necesita que su receta se enve electrnicamente a Chiropodist, informe a nuestra oficina a travs  [place] o por telfono llamando al [phone] y presione la opcin 4.

## 2022-06-28 NOTE — Progress Notes (Signed)
Follow-Up Visit   Subjective  Henry Wu is a 46 y.o. male who presents for the following: Psoriasis (Arms, legs, elbows, knees, 1mf/u, Taltz '80mg'$ /ml sq injections q 4wks, Mometasone cr prn, pt has hx of back pain, pt states sometimes gets a little red at injection sites), check spots (Face, abdomen, >660mno symptoms), and scars (Abdomen, ~42m57mrom colon surgery). The patient has spots, moles and lesions to be evaluated, some may be new or changing and the patient has concerns that these could be cancer.  The following portions of the chart were reviewed this encounter and updated as appropriate:   Tobacco  Allergies  Meds  Problems  Med Hx  Surg Hx  Fam Hx     Review of Systems:  No other skin or systemic complaints except as noted in HPI or Assessment and Plan.  Objective  Well appearing patient in no apparent distress; mood and affect are within normal limits.  A focused examination was performed including arms, legs, abdomen, face. Relevant physical exam findings are noted in the Assessment and Plan.  arms, legs, elbows, knees Minimal guttate areas legs, elbows clear  L cheek Dilated blood vessel  abdomen Scars abdomen   Assessment & Plan   History of Colon Surgery -01/2022 due to diverticulitis  Hemangiomas - Red papules - Discussed benign nature - Observe - Call for any changes  - trunk  Psoriasis arms, legs, elbows, knees Chronic and persistent condition with duration or expected duration over one year. Condition is symptomatic / bothersome to patient.  Psoriasis - severe on systemic "biologic" treatment injections.  Psoriasis is a chronic non-curable, but treatable genetic/hereditary disease that may have other systemic features affecting other organ systems such as joints (Psoriatic Arthritis).  It is linked with heart disease, inflammatory bowel disease, non-alcoholic fatty liver disease, and depression. Significant skin psoriasis and/or  psoriatic arthritis may have significant symptoms and affects activities of daily activity and often benefits from systemic "biologic" injection treatments.  These "biologic" treatments have some potential side effects including immunosuppression and require pre-treatment laboratory screening and periodic laboratory monitoring and periodic in person evaluation and monitoring by the attending dermatologist physician (long term medication management).   Pt with hx of TB, Chest X-ray from 12/23/2021 clear Hx of Osteoarthritis/degenerative disc disease, Rheumatology 11/07/2017 Getting mild injection site reaction Labs from 07/07/2, 03/03/22 WNL  Cont Taltz '80mg'$ /ml sq injections q 4 wks Cont Mometasone cr qd up to 5d/wk prn flares  Reviewed risks of biologics including immunosuppression, infections, injection site reaction, and failure to improve condition. Goal is control of skin condition, not cure.  Some older biologics such as Humira and Enbrel may slightly increase risk of malignancy and may worsen congestive heart failure.  Talz and Cosentyx may cause inflammatory bowel disease to flare. The use of biologics requires long term medication management, including periodic office visits and monitoring of blood work.   Topical steroids (such as triamcinolone, fluocinolone, fluocinonide, mometasone, clobetasol, halobetasol, betamethasone, hydrocortisone) can cause thinning and lightening of the skin if they are used for too long in the same area. Your physician has selected the right strength medicine for your problem and area affected on the body. Please use your medication only as directed by your physician to prevent side effects.    Related Medications mometasone (ELOCON) 0.1 % cream Apply 1 Application topically daily. Qd up to 5 days a week to aa psoriasis on body prn flares  Ixekizumab (TALTZ) 80 MG/ML SOAJ INJECT THE  CONTENTS OF 1 PEN UNDER THE SKIN EVERY 4 WEEKS  Telangiectasia L  cheek Benign, observe.    Scar abdomen 2ndary to colon surgery Discussed Serica scar gel, information given. Discussed [place] Kenalog injections in the future.  Return in about 6 months (around 12/29/2022) for Psoriasis f/u.  I, Othelia Pulling, RMA, am acting as scribe for Sarina Ser, MD . Documentation: I have reviewed the above documentation for accuracy and completeness, and I agree with the above.  Sarina Ser, MD

## 2022-07-08 ENCOUNTER — Encounter: Payer: Self-pay | Admitting: Dermatology

## 2023-01-09 ENCOUNTER — Ambulatory Visit (INDEPENDENT_AMBULATORY_CARE_PROVIDER_SITE_OTHER): Payer: BC Managed Care – PPO | Admitting: Dermatology

## 2023-01-09 ENCOUNTER — Encounter: Payer: Self-pay | Admitting: Dermatology

## 2023-01-09 VITALS — BP 134/98 | HR 98

## 2023-01-09 DIAGNOSIS — L409 Psoriasis, unspecified: Secondary | ICD-10-CM

## 2023-01-09 DIAGNOSIS — L578 Other skin changes due to chronic exposure to nonionizing radiation: Secondary | ICD-10-CM | POA: Diagnosis not present

## 2023-01-09 DIAGNOSIS — Z1283 Encounter for screening for malignant neoplasm of skin: Secondary | ICD-10-CM

## 2023-01-09 DIAGNOSIS — L821 Other seborrheic keratosis: Secondary | ICD-10-CM

## 2023-01-09 DIAGNOSIS — Z79899 Other long term (current) drug therapy: Secondary | ICD-10-CM

## 2023-01-09 DIAGNOSIS — L814 Other melanin hyperpigmentation: Secondary | ICD-10-CM

## 2023-01-09 DIAGNOSIS — D229 Melanocytic nevi, unspecified: Secondary | ICD-10-CM

## 2023-01-09 MED ORDER — TALTZ 80 MG/ML ~~LOC~~ SOAJ: SUBCUTANEOUS | 6 refills | Status: DC

## 2023-01-09 NOTE — Progress Notes (Addendum)
Follow-Up Visit   Subjective  Henry Wu is a 47 y.o. male who presents for the following: Psoriasis (6 month follow up. Arms, elbows, legs, knees. On Taltz 80mg /ml injections every 4 weeks). The patient presents for Total-Body Skin Exam (TBSE) for skin cancer screening and mole check.  The patient has spots, moles and lesions to be evaluated, some may be new or changing and the patient has concerns that these could be cancer. The patient's psoriasis is improved on Taltz and he is pleased.  The following portions of the chart were reviewed this encounter and updated as appropriate:  Tobacco  Allergies  Meds  Problems  Med Hx  Surg Hx  Fam Hx     Review of Systems: No other skin or systemic complaints except as noted in HPI or Assessment and Plan.  Objective  Well appearing patient in no apparent distress; mood and affect are within normal limits.  A full examination was performed including scalp, head, eyes, ears, nose, lips, neck, chest, axillae, abdomen, back, buttocks, bilateral upper extremities, bilateral lower extremities, hands, feet, fingers, toes, fingernails, and toenails. All findings within normal limits unless otherwise noted below.  arms, legs Erythematous psoriatic plaque at arm   Assessment & Plan  Psoriasis Improved on Taltz - Pt pleased and wants to continue. BSA today is 1%.  Previous BSA 20% pre-treatment with Altamease Oiler. No side effects from treatment. arms, legs Chronic and persistent condition with duration or expected duration over one year. Condition is symptomatic / bothersome to patient.  Psoriasis - severe on systemic "biologic" treatment injections.  Psoriasis is a chronic non-curable, but treatable genetic/hereditary disease that may have other systemic features affecting other organ systems such as joints (Psoriatic Arthritis).  It is linked with heart disease, inflammatory bowel disease, non-alcoholic fatty liver disease, and depression.  Significant skin psoriasis and/or psoriatic arthritis may have significant symptoms and affects activities of daily activity and often benefits from systemic "biologic" injection treatments.  These "biologic" treatments have some potential side effects including immunosuppression and require pre-treatment laboratory screening and periodic laboratory monitoring and periodic in person evaluation and monitoring by the attending dermatologist physician (long term medication management).    Pt with hx of TB, Chest X-ray from 12/23/2021 clear Hx of Osteoarthritis/degenerative disc disease, Rheumatology 11/07/2017 Labs from 06/17/21, 03/03/22, 09/2022 WNL   Cont Taltz 80mg /ml sq injections q 4 wks Cont Mometasone cr qd up to 5d/wk prn flares   Reviewed risks of biologics including immunosuppression, infections, injection site reaction, and failure to improve condition. Goal is control of skin condition, not cure.  Some older biologics such as Humira and Enbrel may slightly increase risk of malignancy and may worsen congestive heart failure.  Talz and Cosentyx may cause inflammatory bowel disease to flare. The use of biologics requires long term medication management, including periodic office visits and monitoring of blood work.    Topical steroids (such as triamcinolone, fluocinolone, fluocinonide, mometasone, clobetasol, halobetasol, betamethasone, hydrocortisone) can cause thinning and lightening of the skin if they are used for too long in the same area. Your physician has selected the right strength medicine for your problem and area affected on the body. Please use your medication only as directed by your physician to prevent side effects.    Counseling on psoriasis and coordination of care  psoriasis is a chronic non-curable, but treatable genetic/hereditary disease that may have other systemic features affecting other organ systems such as joints (Psoriatic Arthritis). It is associated with an  increased risk of inflammatory bowel disease, heart disease, non-alcoholic fatty liver disease, and depression.  Treatments include light and laser treatments; topical medications; and systemic medications including oral and injectables.  DG Chest 2 View - arms, legs  Ixekizumab (TALTZ) 80 MG/ML SOAJ - arms, legs INJECT THE CONTENTS OF 1 PEN UNDER THE SKIN EVERY 4 WEEKS Related Medications mometasone (ELOCON) 0.1 % cream Apply 1 Application topically daily. Qd up to 5 days a week to aa psoriasis on body prn flares  Encounter for long-term (current) use of medications Related Procedures DG Chest 2 View Long term medication management.  Patient is using long term (months to years) prescription medication  to control their dermatologic condition.  These medications require periodic monitoring to evaluate for efficacy and side effects and may require periodic laboratory monitoring.   Lentigines - Scattered tan macules - Due to sun exposure - Benign-appearing, observe - Recommend daily broad spectrum sunscreen SPF 30+ to sun-exposed areas, reapply every 2 hours as needed. - Call for any changes  Seborrheic Keratoses - Stuck-on, waxy, tan-brown papules and/or plaques  - Benign-appearing - Discussed benign etiology and prognosis. - Observe - Call for any changes  Melanocytic Nevi - Tan-brown and/or pink-flesh-colored symmetric macules and papules - Benign appearing on exam today - Observation - Call clinic for new or changing moles - Recommend daily use of broad spectrum spf 30+ sunscreen to sun-exposed areas.   Hemangiomas - Red papules - Discussed benign nature - Observe - Call for any changes  Actinic Damage - Chronic condition, secondary to cumulative UV/sun exposure - diffuse scaly erythematous macules with underlying dyspigmentation - Recommend daily broad spectrum sunscreen SPF 30+ to sun-exposed areas, reapply every 2 hours as needed.  - Staying in the shade or wearing  long sleeves, sun glasses (UVA+UVB protection) and wide brim hats (4-inch brim around the entire circumference of the hat) are also recommended for sun protection.  - Call for new or changing lesions.  Skin cancer screening performed today.  Return in about 6 months (around 07/10/2023) for Psoriasis Follow Up.  I, Lawson Radar, CMA, am acting as scribe for Armida Sans, MD. Documentation: I have reviewed the above documentation for accuracy and completeness, and I agree with the above.  Armida Sans, MD

## 2023-01-09 NOTE — Patient Instructions (Addendum)
Continue Taltz as directed.  Cont Mometasone cr qd up to 5d/wk prn flares   Reviewed risks of biologics including immunosuppression, infections, injection site reaction, and failure to improve condition. Goal is control of skin condition, not cure.  Some older biologics such as Humira and Enbrel may slightly increase risk of malignancy and may worsen congestive heart failure.  Talz and Cosentyx may cause inflammatory bowel disease to flare. The use of biologics requires long term medication management, including periodic office visits and monitoring of blood work.  Due to recent changes in healthcare laws, you may see results of your pathology and/or laboratory studies on MyChart before the doctors have had a chance to review them. We understand that in some cases there may be results that are confusing or concerning to you. Please understand that not all results are received at the same time and often the doctors may need to interpret multiple results in order to provide you with the best plan of care or course of treatment. Therefore, we ask that you please give [place] 2 business days to thoroughly review all your results before contacting the office for clarification. Should we see a critical lab result, you will be contacted sooner.   If You Need Anything After Your Visit  If you have any questions or concerns for your doctor, please call our main line at [phone] and press option 4 to reach your doctor's medical assistant. If no one answers, please leave a voicemail as directed and we will return your call as soon as possible. Messages left after 4 pm will be answered the following business day.   You may also send [place] a message via [place]. We typically respond to MyChart messages within 1-2 business days.  For prescription refills, please ask your pharmacy to contact our office. Our fax number is [phone].  If you have an urgent issue when the clinic is closed that cannot wait until the  next business day, you can page your doctor at the number below.    Please note that while we do our best to be available for urgent issues outside of office hours, we are not available 24/7.   If you have an urgent issue and are unable to reach [place], you may choose to seek medical care at your doctor's office, retail clinic, urgent care center, or emergency room.  If you have a medical emergency, please immediately call 911 or go to the emergency department.  Pager Numbers  - Dr. Nehemiah Massed: [phone]  - Dr. Laurence Ferrari: [phone]  - Dr. Nicole Kindred: [phone]  In the event of inclement weather, please call our main line at [phone] for an update on the status of any delays or closures.  Dermatology Medication Tips: Please keep the boxes that topical medications come in in order to help keep track of the instructions about where and how to use these. Pharmacies typically print the medication instructions only on the boxes and not directly on the medication tubes.   If your medication is too expensive, please contact our office at [phone] option 4 or send [place] a message through [place].   We are unable to tell what your co-pay for medications will be in advance as this is different depending on your insurance coverage. However, we may be able to find a substitute medication at lower cost or fill out paperwork to get insurance to cover a needed medication.   If a prior authorization is required to get your medication covered by your insurance company,  please allow [place] 1-2 business days to complete this process.  Drug prices often vary depending on where the prescription is filled and some pharmacies may offer cheaper prices.  The website www.goodrx.com contains coupons for medications through different pharmacies. The prices here do not account for what the cost may be with help from insurance (it may be cheaper with your insurance), but the website can give you the price if you did not  use any insurance.  - You can print the associated coupon and take it with your prescription to the pharmacy.  - You may also stop by our office during regular business hours and pick up a GoodRx coupon card.  - If you need your prescription sent electronically to a different pharmacy, notify our office through [facility] or by phone at [phone] option 4.     Si Usted Necesita Algo Despus de Su Visita  Tambin puede enviarnos un mensaje a travs de Pharmacist, community. Por lo general respondemos a los mensajes de MyChart en el transcurso de 1 a 2 das hbiles.  Para renovar recetas, por favor pida a su farmacia que se ponga en contacto con nuestra oficina. Harland Dingwall de fax es [place] [phone].  Si tiene un asunto urgente cuando la clnica est cerrada y que no puede esperar hasta el siguiente da hbil, puede llamar/localizar a su doctor(a) al nmero que aparece a continuacin.   Por favor, tenga en cuenta que aunque hacemos todo lo posible para estar disponibles para asuntos urgentes fuera del horario de [place], no estamos disponibles las 24 horas del da, los 7 das de la [place].   Si tiene un problema urgente y no puede comunicarse con nosotros, puede optar por buscar atencin mdica  en el consultorio de su doctor(a), en una clnica privada, en un centro de atencin urgente o en una sala de emergencias.  Si tiene Engineering geologist, por favor llame inmediatamente al 911 o vaya a la sala de emergencias.  Nmeros de bper  - Dr. Nehemiah Massed: [phone]  - Dra. Moye: [phone]  - Dra. Nicole Kindred: [phone]  En caso de inclemencias del [place], por favor llame a Johnsie Kindred principal al [phone] para una actualizacin sobre el [place] de cualquier retraso o cierre.  Consejos para la medicacin en dermatologa: Por favor, guarde las cajas en las que vienen los medicamentos de uso tpico para ayudarle a seguir las instrucciones sobre dnde y cmo usarlos. Las farmacias  generalmente imprimen las instrucciones del medicamento slo en las cajas y no directamente en los tubos del [place].   Si su medicamento es muy caro, por favor, pngase en contacto con Zigmund Daniel llamando al [phone] y presione la opcin 4 o envenos un mensaje a travs de Pharmacist, community.   No podemos decirle cul ser su copago por los medicamentos por adelantado ya que esto es diferente dependiendo de la cobertura de su seguro. Sin embargo, es posible que podamos encontrar un medicamento sustituto a Electrical engineer un formulario para que el seguro cubra el medicamento que se considera necesario.   Si se requiere una autorizacin previa para que su compaa de seguros [place] su medicamento, por favor permtanos de 1 a 2 das hbiles para completar este proceso.  Los precios de los medicamentos varan con frecuencia dependiendo del Environmental consultant de dnde se surte la receta y alguna farmacias pueden ofrecer precios ms baratos.  El sitio web www.goodrx.com tiene cupones para medicamentos de Airline pilot. Los precios aqu no tienen en cuenta lo  que podra costar con la ayuda del seguro (puede ser ms barato con su seguro), pero el sitio web puede darle el precio si no Field seismologist.  - Puede imprimir el cupn correspondiente y llevarlo con su receta a la farmacia.  - Tambin puede pasar por nuestra oficina durante el horario de atencin regular y Charity fundraiser una tarjeta de cupones de GoodRx.  - Si necesita que su receta se enve electrnicamente a una farmacia diferente, informe a nuestra oficina a travs de MyChart de [place] o por telfono llamando al [phone] y presione la opcin 4.

## 2023-01-13 ENCOUNTER — Encounter: Payer: Self-pay | Admitting: Dermatology

## 2023-01-13 ENCOUNTER — Ambulatory Visit
Admission: RE | Admit: 2023-01-13 | Discharge: 2023-01-13 | Disposition: A | Discharge status: Home / Self Care | Payer: BC Managed Care – PPO | Attending: Dermatology | Admitting: Dermatology

## 2023-01-13 ENCOUNTER — Ambulatory Visit
Admission: RE | Admit: 2023-01-13 | Discharge: 2023-01-13 | Disposition: A | Discharge status: Home / Self Care | Payer: BC Managed Care – PPO | Source: Ambulatory Visit | Attending: Dermatology | Admitting: Dermatology

## 2023-01-13 DIAGNOSIS — L409 Psoriasis, unspecified: Secondary | ICD-10-CM | POA: Diagnosis present

## 2023-01-13 DIAGNOSIS — Z79899 Other long term (current) drug therapy: Secondary | ICD-10-CM | POA: Diagnosis not present

## 2023-01-16 ENCOUNTER — Telehealth: Payer: Self-pay

## 2023-01-16 DIAGNOSIS — L409 Psoriasis, unspecified: Secondary | ICD-10-CM

## 2023-01-16 MED ORDER — TALTZ 80 MG/ML ~~LOC~~ SOAJ: SUBCUTANEOUS | 6 refills | Status: DC

## 2023-01-16 NOTE — Telephone Encounter (Signed)
Advised patient of xray results and sent in refills of Taltz/hd

## 2023-01-16 NOTE — Telephone Encounter (Signed)
-----   Message from Ralene Bathe, MD sent at 01/13/2023  6:47 PM EST ----- Chest xray from 01/13/2023 was normal / Clear.  No evidence of TB or other problem.  Continue Taltz for Psoriasis. (If medication not already sent in, please send Taltz) Keep 6 mos follow up appt.

## 2023-05-03 ENCOUNTER — Other Ambulatory Visit: Payer: Self-pay

## 2023-05-03 DIAGNOSIS — L409 Psoriasis, unspecified: Secondary | ICD-10-CM

## 2023-05-03 MED ORDER — TALTZ 80 MG/ML ~~LOC~~ SOAJ: SUBCUTANEOUS | 6 refills | Status: DC

## 2023-05-03 NOTE — Progress Notes (Signed)
[facility] faxed pharmacy change request from them to [facility]. Escripted to [facility]

## 2023-06-21 ENCOUNTER — Ambulatory Visit: Payer: BC Managed Care – PPO | Admitting: Dermatology

## 2023-06-28 ENCOUNTER — Ambulatory Visit: Payer: BC Managed Care – PPO | Admitting: Dermatology

## 2023-09-14 ENCOUNTER — Encounter: Payer: Self-pay | Admitting: Dermatology

## 2023-09-14 ENCOUNTER — Ambulatory Visit (INDEPENDENT_AMBULATORY_CARE_PROVIDER_SITE_OTHER): Payer: BC Managed Care – PPO | Admitting: Dermatology

## 2023-09-14 ENCOUNTER — Other Ambulatory Visit: Payer: Self-pay

## 2023-09-14 DIAGNOSIS — Z79899 Other long term (current) drug therapy: Secondary | ICD-10-CM

## 2023-09-14 DIAGNOSIS — Z8739 Personal history of other diseases of the musculoskeletal system and connective tissue: Secondary | ICD-10-CM | POA: Diagnosis not present

## 2023-09-14 DIAGNOSIS — L409 Psoriasis, unspecified: Secondary | ICD-10-CM

## 2023-09-14 DIAGNOSIS — Z7189 Other specified counseling: Secondary | ICD-10-CM | POA: Diagnosis not present

## 2023-09-14 MED ORDER — ZORYVE 0.3 % EX CREA: TOPICAL_CREAM | CUTANEOUS | 3 refills | Status: DC

## 2023-09-14 MED ORDER — TALTZ 80 MG/ML ~~LOC~~ SOAJ
SUBCUTANEOUS | 6 refills | Status: DC
Start: 2023-09-14 — End: 2024-07-18

## 2023-09-14 NOTE — Patient Instructions (Signed)
Cont Taltz 80mg /ml sq injections every 4 wks Cont Mometasone cr daily up to 5d/wk prn flares  Reviewed risks of biologics including immunosuppression, infections, injection site reaction, and failure to improve condition. Goal is control of skin condition, not cure.  Some older biologics such as Humira and Enbrel may slightly increase risk of malignancy and may worsen congestive heart failure.  Taltz and Cosentyx may cause inflammatory bowel disease to flare. The use of biologics requires long term medication management, including periodic office visits and monitoring of blood work.   Topical steroids (such as triamcinolone, fluocinolone, fluocinonide, mometasone, clobetasol, halobetasol, betamethasone, hydrocortisone) can cause thinning and lightening of the skin if they are used for too long in the same area. Your physician has selected the right strength medicine for your problem and area affected on the body. Please use your medication only as directed by your physician to prevent side effects.    Due to recent changes in healthcare laws, you may see results of your pathology and/or laboratory studies on MyChart before the doctors have had a chance to review them. We understand that in some cases there may be results that are confusing or concerning to you. Please understand that not all results are received at the same time and often the doctors may need to interpret multiple results in order to provide you with the best plan of care or course of treatment. Therefore, we ask that you please give [place] 2 business days to thoroughly review all your results before contacting the office for clarification. Should we see a critical lab result, you will be contacted sooner.   If You Need Anything After Your Visit  If you have any questions or concerns for your doctor, please call our main line at [phone] and press option 4 to reach your doctor's medical assistant. If no one answers, please leave a  voicemail as directed and we will return your call as soon as possible. Messages left after 4 pm will be answered the following business day.   You may also send [place] a message via MyChart. We typically respond to MyChart messages within 1-2 business days.  For prescription refills, please ask your pharmacy to contact our office. Our fax number is [phone].  If you have an urgent issue when the clinic is closed that cannot wait until the next business day, you can page your doctor at the number below.    Please note that while we do our best to be available for urgent issues outside of office hours, we are not available 24/7.   If you have an urgent issue and are unable to reach [place], you may choose to seek medical care at your doctor's office, retail clinic, urgent care center, or emergency room.  If you have a medical emergency, please immediately call 911 or go to the emergency department.  Pager Numbers  - Dr. Gwen Pounds: [phone]  - Dr. Roseanne Reno: [phone]  - Dr. Katrinka Blazing: [phone]   In the event of inclement weather, please call our main line at [phone] for an update on the status of any delays or closures.  Dermatology Medication Tips: Please keep the boxes that topical medications come in in order to help keep track of the instructions about where and how to use these. Pharmacies typically print the medication instructions only on the boxes and not directly on the medication tubes.   If your medication is too expensive, please contact our office at [phone] option 4 or send [place] a message  through MyChart.   We are unable to tell what your co-pay for medications will be in advance as this is different depending on your insurance coverage. However, we may be able to find a substitute medication at lower cost or fill out paperwork to get insurance to cover a needed medication.   If a prior authorization is required to get your medication covered by your insurance  company, please allow [place] 1-2 business days to complete this process.  Drug prices often vary depending on where the prescription is filled and some pharmacies may offer cheaper prices.  The website www.goodrx.com contains coupons for medications through different pharmacies. The prices here do not account for what the cost may be with help from insurance (it may be cheaper with your insurance), but the website can give you the price if you did not use any insurance.  - You can print the associated coupon and take it with your prescription to the pharmacy.  - You may also stop by our office during regular business hours and pick up a GoodRx coupon card.  - If you need your prescription sent electronically to a different pharmacy, notify our office through [facility] or by phone at [phone] option 4.     Si Usted Necesita Algo Despus de Su Visita  Tambin puede enviarnos un mensaje a travs de Clinical cytogeneticist. Por lo general respondemos a los mensajes de MyChart en el transcurso de 1 a 2 das hbiles.  Para renovar recetas, por favor pida a su farmacia que se ponga en contacto con nuestra oficina. Annie Sable de fax es [place] [phone].  Si tiene un asunto urgente cuando la clnica est cerrada y que no puede esperar hasta el siguiente da hbil, puede llamar/localizar a su doctor(a) al nmero que aparece a continuacin.   Por favor, tenga en cuenta que aunque hacemos todo lo posible para estar disponibles para asuntos urgentes fuera del horario de [place], no estamos disponibles las 24 horas del da, los 7 [address]  [address] de la [place].   Si tiene un problema urgente y no puede comunicarse con nosotros, puede optar por buscar atencin mdica  en el consultorio de su doctor(a), en una clnica privada, en un centro de atencin urgente o en una sala de emergencias.  Si tiene Engineer, drilling, por favor llame inmediatamente al 911 o vaya a la sala de emergencias.  Nmeros de bper  - Dr.  Gwen Pounds: [phone]  - Dra. Roseanne Reno: [phone]  - Dr. Katrinka Blazing: [phone]   En caso de inclemencias del tiempo, por favor llame a Lacy Duverney principal al [phone] para una actualizacin sobre el [place] de cualquier retraso o cierre.  Consejos para la medicacin en dermatologa: Por favor, guarde las cajas en las que vienen los medicamentos de uso tpico para ayudarle a seguir las instrucciones sobre dnde y cmo usarlos. Las farmacias generalmente imprimen las instrucciones del medicamento slo en las cajas y no directamente en los tubos del [place].   Si su medicamento es muy caro, por favor, pngase en contacto con Rolm Gala llamando al [phone] y presione la opcin 4 o envenos un mensaje a travs de Clinical cytogeneticist.   No podemos decirle cul ser su copago por los medicamentos por adelantado ya que esto es diferente dependiendo de la cobertura de su seguro. Sin embargo, es posible que podamos encontrar un medicamento sustituto a Audiological scientist un formulario para que el seguro cubra el medicamento que se considera necesario.   Si se requiere [employer]  autorizacin previa para que su compaa de seguros [place] su medicamento, por favor permtanos de 1 a 2 das hbiles para completar [address]  Los precios de los medicamentos varan con frecuencia dependiendo del Environmental consultant de dnde se surte la receta y alguna farmacias pueden ofrecer precios ms baratos.  El sitio web www.goodrx.com tiene cupones para medicamentos de Health and safety inspector. Los precios aqu no tienen en cuenta lo que podra costar con la ayuda del seguro (puede ser ms barato con su seguro), pero el sitio web puede darle el precio si no utiliz Tourist information centre manager.  - Puede imprimir el cupn correspondiente y llevarlo con su receta a la farmacia.  - Tambin puede pasar por nuestra oficina durante el horario de atencin regular y Education officer, museum una tarjeta de cupones de GoodRx.  - Si necesita que su receta se enve  electrnicamente a una farmacia diferente, informe a nuestra oficina a travs de MyChart de [place] o por telfono llamando al [phone] y presione la opcin 4.

## 2023-09-14 NOTE — Progress Notes (Signed)
Follow-Up Visit   Subjective  Henry Wu is a 47 y.o. male who presents for the following: Psoriasis. (6 month follow up. Arms, elbows, legs, knees. On Taltz 80mg /ml injections every 4 weeks. The patient's psoriasis is improved on Taltz and he is pleased. Denies adverse reactions, side effects or injection site reactions.   The following portions of the chart were reviewed this encounter and updated as appropriate: medications, allergies, medical history  Review of Systems:  No other skin or systemic complaints except as noted in HPI or Assessment and Plan.  Objective  Well appearing patient in no apparent distress; mood and affect are within normal limits.  Areas Examined: Face, arms, hands  Relevant exam findings are noted in the Assessment and Plan.   Assessment & Plan   Psoriasis  Related Medications mometasone (ELOCON) 0.1 % cream Apply 1 Application topically daily. Qd up to 5 days a week to aa psoriasis on body prn flares   PSORIASIS on systemic treatment Improved on Taltz - Pt pleased and wants to continue. BSA today is 1%.  Previous BSA 20% pre-treatment with Altamease [place]. No side effects from treatment.  Chronic condition with duration or expected duration over one year. Currently well-controlled.  Counseling and coordination of care for severe psoriasis on systemic treatment  Psoriasis - severe on systemic treatment.  Psoriasis is a chronic non-curable, but treatable genetic/hereditary disease that may have other systemic features affecting other organ systems such as joints (Psoriatic Arthritis).  It is linked with heart disease, inflammatory bowel disease, non-alcoholic fatty liver disease, and depression. Significant skin psoriasis and/or psoriatic arthritis may have significant symptoms and affects activities of daily activity and often benefits from systemic treatments.  These systemic treatments have some potential side effects including immunosuppression and  require pre-treatment laboratory screening and periodic laboratory monitoring and periodic in person evaluation and monitoring by the attending dermatologist physician (long term medication management).   Pt with hx of TB, Chest X-ray from 01/13/2023 clear  Hx of Osteoarthritis/degenerative disc disease, Rheumatology 11/07/2017   Treatment Plan: Cont Taltz 80mg /ml sq injections every 4 wks Cont Mometasone cr daily up to 5d/wk prn flares, but would like to switch topical to a non-steroid = Start Zoryve cream twice daily to affected areas as needed.   Reviewed risks of biologics including immunosuppression, infections, injection site reaction, and failure to improve condition. Goal is control of skin condition, not cure.  Some older biologics such as Humira and Enbrel may slightly increase risk of malignancy and may worsen congestive heart failure.  Taltz and Cosentyx may cause inflammatory bowel disease to flare. The use of biologics requires long term medication management, including periodic office visits and monitoring of blood work.   Topical steroids (such as triamcinolone, fluocinolone, fluocinonide, mometasone, clobetasol, halobetasol, betamethasone, hydrocortisone) can cause thinning and lightening of the skin if they are used for too long in the same area. Your physician has selected the right strength medicine for your problem and area affected on the body. Please use your medication only as directed by your physician to prevent side effects.   Plan chest X-ray at next visit.   Long term medication management.  Patient is using long term (months to years) prescription medication  to control their dermatologic condition.  These medications require periodic monitoring to evaluate for efficacy and side effects and may require periodic laboratory monitoring.   Return in about 6 months (around 03/14/2024) for TBSE, Psoriasis Follow Up.  I, Lawson Radar, CMA, am acting  as scribe for Armida Sans, MD.   Documentation: I have reviewed the above documentation for accuracy and completeness, and I agree with the above.  Armida Sans, MD

## 2023-09-16 ENCOUNTER — Encounter: Payer: Self-pay | Admitting: Dermatology

## 2024-03-28 ENCOUNTER — Telehealth: Payer: Self-pay

## 2024-03-28 NOTE — Telephone Encounter (Signed)
 Most recent office visit note faxed to prior authorization department at [phone]. Confirmation sheet confirmed transmission sent.

## 2024-04-16 ENCOUNTER — Ambulatory Visit (INDEPENDENT_AMBULATORY_CARE_PROVIDER_SITE_OTHER): Payer: BC Managed Care – PPO | Admitting: Dermatology

## 2024-04-16 ENCOUNTER — Ambulatory Visit
Admission: RE | Admit: 2024-04-16 | Discharge: 2024-04-16 | Disposition: A | Discharge status: Home / Self Care | Attending: Dermatology | Admitting: Dermatology

## 2024-04-16 ENCOUNTER — Encounter: Payer: Self-pay | Admitting: Dermatology

## 2024-04-16 ENCOUNTER — Ambulatory Visit
Admission: RE | Admit: 2024-04-16 | Discharge: 2024-04-16 | Disposition: A | Discharge status: Home / Self Care | Source: Ambulatory Visit | Attending: Dermatology | Admitting: Dermatology

## 2024-04-16 DIAGNOSIS — Z1283 Encounter for screening for malignant neoplasm of skin: Secondary | ICD-10-CM | POA: Diagnosis not present

## 2024-04-16 DIAGNOSIS — Z79899 Other long term (current) drug therapy: Secondary | ICD-10-CM

## 2024-04-16 DIAGNOSIS — L821 Other seborrheic keratosis: Secondary | ICD-10-CM

## 2024-04-16 DIAGNOSIS — L570 Actinic keratosis: Secondary | ICD-10-CM | POA: Diagnosis not present

## 2024-04-16 DIAGNOSIS — D1801 Hemangioma of skin and subcutaneous tissue: Secondary | ICD-10-CM | POA: Diagnosis not present

## 2024-04-16 DIAGNOSIS — Z7189 Other specified counseling: Secondary | ICD-10-CM

## 2024-04-16 DIAGNOSIS — Z8611 Personal history of tuberculosis: Secondary | ICD-10-CM

## 2024-04-16 DIAGNOSIS — W908XXA Exposure to other nonionizing radiation, initial encounter: Secondary | ICD-10-CM

## 2024-04-16 DIAGNOSIS — L578 Other skin changes due to chronic exposure to nonionizing radiation: Secondary | ICD-10-CM

## 2024-04-16 DIAGNOSIS — Z86018 Personal history of other benign neoplasm: Secondary | ICD-10-CM

## 2024-04-16 DIAGNOSIS — D229 Melanocytic nevi, unspecified: Secondary | ICD-10-CM

## 2024-04-16 DIAGNOSIS — L814 Other melanin hyperpigmentation: Secondary | ICD-10-CM

## 2024-04-16 DIAGNOSIS — L409 Psoriasis, unspecified: Secondary | ICD-10-CM | POA: Diagnosis present

## 2024-04-16 MED ORDER — MOMETASONE FUROATE 0.1 % EX SOLN: CUTANEOUS | 4 refills | Status: AC

## 2024-04-16 MED ORDER — ZORYVE 0.3 % EX CREA: TOPICAL_CREAM | CUTANEOUS | 3 refills | Status: AC

## 2024-04-16 MED ORDER — MOMETASONE FUROATE 0.1 % EX CREA: 1.0000 | TOPICAL_CREAM | Freq: Every day | CUTANEOUS | 4 refills | Status: DC

## 2024-04-16 NOTE — Patient Instructions (Addendum)
Actinic keratoses are precancerous spots that appear secondary to cumulative UV radiation exposure/sun exposure over time. They are chronic with expected duration over 1 year. A portion of actinic keratoses will progress to squamous cell carcinoma of the skin. It is not possible to reliably predict which spots will progress to skin cancer and so treatment is recommended to prevent development of skin cancer.  Recommend daily broad spectrum sunscreen SPF 30+ to sun-exposed areas, reapply every 2 hours as needed.  Recommend staying in the shade or wearing long sleeves, sun glasses (UVA+UVB protection) and wide brim hats (4-inch brim around the entire circumference of the hat). Call for new or changing lesions.    Cryotherapy Aftercare  Wash gently with soap and water everyday.   Apply Vaseline and Band-Aid daily until healed.     Melanoma ABCDEs  Melanoma is the most dangerous type of skin cancer, and is the leading cause of death from skin disease.  You are more likely to develop melanoma if you: Have light-colored skin, light-colored eyes, or red or blond hair Spend a lot of time in the sun Tan regularly, either outdoors or in a tanning bed Have had blistering sunburns, especially during childhood Have a close family member who has had a melanoma Have atypical moles or large birthmarks  Early detection of melanoma is key since treatment is typically straightforward and cure rates are extremely high if we catch it early.   The first sign of melanoma is often a change in a mole or a new dark spot.  The ABCDE system is a way of remembering the signs of melanoma.  A for asymmetry:  The two halves do not match. B for border:  The edges of the growth are irregular. C for color:  A mixture of colors are present instead of an even brown color. D for diameter:  Melanomas are usually (but not always) greater than 6mm - the size of a pencil eraser. E for evolution:  The spot keeps changing in  size, shape, and color.  Please check your skin once per month between visits. You can use a small mirror in front and a large mirror behind you to keep an eye on the back side or your body.   If you see any new or changing lesions before your next follow-up, please call to schedule a visit.  Please continue daily skin protection including broad spectrum sunscreen SPF 30+ to sun-exposed areas, reapplying every 2 hours as needed when you're outdoors.   Staying in the shade or wearing long sleeves, sun glasses (UVA+UVB protection) and wide brim hats (4-inch brim around the entire circumference of the hat) are also recommended for sun protection.    Due to recent changes in healthcare laws, you may see results of your pathology and/or laboratory studies on MyChart before the doctors have had a chance to review them. We understand that in some cases there may be results that are confusing or concerning to you. Please understand that not all results are received at the same time and often the doctors may need to interpret multiple results in order to provide you with the best plan of care or course of treatment. Therefore, we ask that you please give [place] 2 business days to thoroughly review all your results before contacting the office for clarification. Should we see a critical lab result, you will be contacted sooner.   If You Need Anything After Your Visit  If you have any questions or concerns for  your doctor, please call our main line at [phone] and press option 4 to reach your doctor's medical assistant. If no one answers, please leave a voicemail as directed and we will return your call as soon as possible. Messages left after 4 pm will be answered the following business day.   You may also send [place] a message via MyChart. We typically respond to MyChart messages within 1-2 business days.  For prescription refills, please ask your pharmacy to contact our office. Our fax number is  [phone].  If you have an urgent issue when the clinic is closed that cannot wait until the next business day, you can page your doctor at the number below.    Please note that while we do our best to be available for urgent issues outside of office hours, we are not available 24/7.   If you have an urgent issue and are unable to reach [place], you may choose to seek medical care at your doctor's office, retail clinic, urgent care center, or emergency room.  If you have a medical emergency, please immediately call 911 or go to the emergency department.  Pager Numbers  - Dr. Gwen Pounds: [phone]  - Dr. Roseanne Reno: [phone]  - Dr. Katrinka Blazing: [phone]   In the event of inclement weather, please call our main line at [phone] for an update on the status of any delays or closures.  Dermatology Medication Tips: Please keep the boxes that topical medications come in in order to help keep track of the instructions about where and how to use these. Pharmacies typically print the medication instructions only on the boxes and not directly on the medication tubes.   If your medication is too expensive, please contact our office at [phone] option 4 or send [place] a message through MyChart.   We are unable to tell what your co-pay for medications will be in advance as this is different depending on your insurance coverage. However, we may be able to find a substitute medication at lower cost or fill out paperwork to get insurance to cover a needed medication.   If a prior authorization is required to get your medication covered by your insurance company, please allow [place] 1-2 business days to complete this process.  Drug prices often vary depending on where the prescription is filled and some pharmacies may offer cheaper prices.  The website www.goodrx.com contains coupons for medications through different pharmacies. The prices here do not account for what the cost may be with help from  insurance (it may be cheaper with your insurance), but the website can give you the price if you did not use any insurance.  - You can print the associated coupon and take it with your prescription to the pharmacy.  - You may also stop by our office during regular business hours and pick up a GoodRx coupon card.  - If you need your prescription sent electronically to a different pharmacy, notify our office through [facility] or by phone at [phone] option 4.     Si Usted Necesita Algo Despus de Su Visita  Tambin puede enviarnos un mensaje a travs de Clinical cytogeneticist. Por lo general respondemos a los mensajes de MyChart en el transcurso de 1 a 2 das hbiles.  Para renovar recetas, por favor pida a su farmacia que se ponga en contacto con nuestra oficina. Annie Sable de fax es [place] [phone].  Si tiene un asunto urgente cuando la clnica est cerrada y que no puede esperar Teacher, adult education  el siguiente da hbil, puede llamar/localizar a su doctor(a) al nmero que aparece a continuacin.   Por favor, tenga en cuenta que aunque hacemos todo lo posible para estar disponibles para asuntos urgentes fuera del horario de [place], no estamos disponibles las 24 horas del da, los 7 [address]  [address] de la [place].   Si tiene un problema urgente y no puede comunicarse con nosotros, puede optar por buscar atencin mdica  en el consultorio de su doctor(a), en una clnica privada, en un centro de atencin urgente o en una sala de emergencias.  Si tiene Engineer, drilling, por favor llame inmediatamente al 911 o vaya a la sala de emergencias.  Nmeros de bper  - Dr. Gwen Pounds: [phone]  - Dra. Roseanne Reno: [phone]  - Dr. Katrinka Blazing: [phone]   En caso de inclemencias del tiempo, por favor llame a Lacy Duverney principal al [phone] para una actualizacin sobre el [place] de cualquier retraso o cierre.  Consejos para la medicacin en dermatologa: Por favor, guarde las cajas en las que vienen los  medicamentos de uso tpico para ayudarle a seguir las instrucciones sobre dnde y cmo usarlos. Las farmacias generalmente imprimen las instrucciones del medicamento slo en las cajas y no directamente en los tubos del [place].   Si su medicamento es muy caro, por favor, pngase en contacto con Rolm Gala llamando al [phone] y presione la opcin 4 o envenos un mensaje a travs de Clinical cytogeneticist.   No podemos decirle cul ser su copago por los medicamentos por adelantado ya que esto es diferente dependiendo de la cobertura de su seguro. Sin embargo, es posible que podamos encontrar un medicamento sustituto a Audiological scientist un formulario para que el seguro cubra el medicamento que se considera necesario.   Si se requiere una autorizacin previa para que su compaa de seguros [place] su medicamento, por favor permtanos de 1 a 2 das hbiles para completar [address]  Los precios de los medicamentos varan con frecuencia dependiendo del Environmental consultant de dnde se surte la receta y alguna farmacias pueden ofrecer precios ms baratos.  El sitio web www.goodrx.com tiene cupones para medicamentos de Health and safety inspector. Los precios aqu no tienen en cuenta lo que podra costar con la ayuda del seguro (puede ser ms barato con su seguro), pero el sitio web puede darle el precio si no utiliz Tourist information centre manager.  - Puede imprimir el cupn correspondiente y llevarlo con su receta a la farmacia.  - Tambin puede pasar por nuestra oficina durante el horario de atencin regular y Education officer, museum una tarjeta de cupones de GoodRx.  - Si necesita que su receta se enve electrnicamente a una farmacia diferente, informe a nuestra oficina a travs de MyChart de [place] o por telfono llamando al [phone] y presione la opcin 4.

## 2024-04-16 NOTE — Progress Notes (Signed)
 Follow-Up Visit   Subjective  Henry Wu is a 48 y.o. male who presents for the following: Skin Cancer Screening and Upper Body Skin Exam Hx of psoriasis, hx of dysplastic nevi Hx of aks,  The patient presents for Upper Body Skin Exam (UBSE) for skin cancer screening and mole check.  The patient has spots, moles and lesions to be evaluated, some may be new or changing and the patient has concerns that these could be cancer.  The following portions of the chart were reviewed this encounter and updated as appropriate: medications, allergies, medical history  Review of Systems:  No other skin or systemic complaints except as noted in HPI or Assessment and Plan.  Objective  Well appearing patient in no apparent distress; mood and affect are within normal limits.  A upper body skin examination was performed including scalp, head, eyes, ears, nose, lips, neck, chest, axillae, abdomen, back, buttocks, bilateral upper extremities  Relevant physical exam findings are noted in the Assessment and Plan.  left superior forehead near hairline x 1 Erythematous thin papules/macules with gritty scale.   Assessment & Plan   SKIN CANCER SCREENING PERFORMED TODAY.  ACTINIC DAMAGE - Chronic condition, secondary to cumulative UV/sun exposure - diffuse scaly erythematous macules with underlying dyspigmentation - Recommend daily broad spectrum sunscreen SPF 30+ to sun-exposed areas, reapply every 2 hours as needed.  - Staying in the shade or wearing long sleeves, sun glasses (UVA+UVB protection) and wide brim hats (4-inch brim around the entire circumference of the hat) are also recommended for sun protection.  - Call for new or changing lesions.  LENTIGINES, SEBORRHEIC KERATOSES, HEMANGIOMAS - Benign normal skin lesions - Benign-appearing - Call for any changes  MELANOCYTIC NEVI - Tan-brown and/or pink-flesh-colored symmetric macules and papules - Benign appearing on exam today -  Observation - Call clinic for new or changing moles - Recommend daily use of broad spectrum spf 30+ sunscreen to sun-exposed areas.   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  Call if any new or changing lesions are noted between office visits  PSORIASIS on systemic treatment Improved on Taltz  - Pt pleased and wants to continue. BSA today is 4% on Taltz .  No side effects from treatment. Exam : Fissuring and scale of elbows  Chronic condition with duration or expected duration over one year. Currently well-controlled. Counseling and coordination of care for severe psoriasis on systemic treatment  Psoriasis - severe on systemic treatment.  Psoriasis is a chronic non-curable, but treatable genetic/hereditary disease that may have other systemic features affecting other organ systems such as joints (Psoriatic Arthritis).  It is linked with heart disease, inflammatory bowel disease, non-alcoholic fatty liver disease, and depression. Significant skin psoriasis and/or psoriatic arthritis may have significant symptoms and affects activities of daily activity and often benefits from systemic treatments.  These systemic treatments have some potential side effects including immunosuppression and require pre-treatment laboratory screening and periodic laboratory monitoring and periodic in person evaluation and monitoring by the attending dermatologist physician (long term medication management).    Pt with hx of TB, Chest X-ray from 01/13/2023 clear  Hx of Osteoarthritis/degenerative disc disease, Rheumatology 11/07/2017  Treatment Plan: Reviewed previous  labs CBC with Diff, Lipid Panel, CMP,  - all within normal range Pending Chest X-ray will continue  Cont Taltz  80mg /ml sq injections every 4 wks Cont Mometasone  Lotion - use to aa's 3 x a week prn,  Continue Zoryve  cream twice daily to affected areas as needed.    Reviewed risks of biologics including immunosuppression, infections, injection site reaction, and failure to improve condition. Goal is control of skin condition, not cure.  Some older biologics such as Humira and Enbrel may slightly increase risk of malignancy and may worsen congestive heart failure.  Taltz  and Cosentyx may cause inflammatory bowel disease to flare. The use of biologics requires long term medication management, including periodic office visits and monitoring of blood work.    Topical steroids (such as triamcinolone, fluocinolone, fluocinonide, mometasone , clobetasol, halobetasol, betamethasone, hydrocortisone) can cause thinning and lightening of the skin if they are used for too long in the same area. Your physician has selected the right strength medicine for your problem and area affected on the body. Please use your medication only as directed by your physician to prevent side effects.     Long term medication management.  Patient is using long term (months to years) prescription medication  to control their dermatologic condition.  These medications require periodic monitoring to evaluate for efficacy and side effects and may require periodic laboratory monitoring  PSORIASIS   Related Procedures DG Chest 2 View Related Medications Roflumilast  (ZORYVE ) 0.3 % CREA Apply once daily to affected areas on body for eczema or psoriasis. mometasone  (ELOCON ) 0.1 % lotion Apply topically to affected areas of eczema/psoriasis 3 days a weekly as needed. ENCOUNTER FOR LONG-TERM (CURRENT) USE OF MEDICATIONS   Related Procedures DG Chest 2 View ACTINIC KERATOSIS left superior forehead near hairline x 1 Actinic keratoses are precancerous spots that appear  secondary to cumulative UV radiation exposure/sun exposure over time. They are chronic with expected duration over 1 year. A portion of actinic keratoses will progress to squamous cell carcinoma of the skin. It is not possible to reliably predict which spots will progress to skin cancer and so treatment is recommended to prevent development of skin cancer.  Recommend daily broad spectrum sunscreen SPF 30+ to sun-exposed areas, reapply every 2 hours as needed.  Recommend staying in the shade or wearing long sleeves, sun glasses (UVA+UVB protection) and wide brim hats (4-inch brim around the entire circumference of the hat). Call for new or changing lesions. Destruction of lesion - left superior forehead near hairline x 1 Complexity: simple   Destruction method: cryotherapy   Informed consent: discussed and consent obtained   Timeout:  patient name, date of birth, surgical site, and procedure verified Lesion destroyed using liquid nitrogen: Yes   Region frozen until ice ball extended beyond lesion: Yes   Outcome: patient tolerated procedure well with no complications   Post-procedure details: wound care instructions given   Return in about 6 months (around 10/17/2024) for psoriasis and tbse .  IRandee Busing, CMA, am acting as scribe for Celine Collard, MD.   Documentation: I have reviewed the above documentation for accuracy and completeness, and I agree with the above.  Celine Collard, MD

## 2024-04-17 ENCOUNTER — Encounter: Payer: Self-pay | Admitting: Dermatology

## 2024-04-17 ENCOUNTER — Telehealth: Payer: Self-pay

## 2024-04-17 NOTE — Telephone Encounter (Signed)
 Patient informed of x-ray results.

## 2024-04-17 NOTE — Telephone Encounter (Signed)
-----   Message from Celine Collard sent at 04/17/2024  4:38 PM EDT ----- Chest Xray from 04/17/2024 (Compared to 01/2023 Xray) was CLEAR  Pt on Taltz  for Psoriasis and has history of TB treated in past.

## 2024-07-18 ENCOUNTER — Encounter: Payer: Self-pay | Admitting: Dermatology

## 2024-07-18 MED ORDER — TALTZ 80 MG/ML ~~LOC~~ SOAJ: SUBCUTANEOUS | 6 refills | Status: AC

## 2024-11-19 ENCOUNTER — Ambulatory Visit: Admitting: Dermatology

## 2024-11-26 ENCOUNTER — Encounter: Payer: Self-pay | Admitting: Dermatology

## 2024-11-26 ENCOUNTER — Ambulatory Visit: Admitting: Dermatology

## 2024-11-26 DIAGNOSIS — L814 Other melanin hyperpigmentation: Secondary | ICD-10-CM

## 2024-11-26 DIAGNOSIS — Z7189 Other specified counseling: Secondary | ICD-10-CM

## 2024-11-26 DIAGNOSIS — Z1283 Encounter for screening for malignant neoplasm of skin: Secondary | ICD-10-CM

## 2024-11-26 DIAGNOSIS — D229 Melanocytic nevi, unspecified: Secondary | ICD-10-CM

## 2024-11-26 DIAGNOSIS — Z86018 Personal history of other benign neoplasm: Secondary | ICD-10-CM

## 2024-11-26 DIAGNOSIS — L409 Psoriasis, unspecified: Secondary | ICD-10-CM

## 2024-11-26 DIAGNOSIS — Z79899 Other long term (current) drug therapy: Secondary | ICD-10-CM

## 2024-11-26 DIAGNOSIS — L578 Other skin changes due to chronic exposure to nonionizing radiation: Secondary | ICD-10-CM

## 2024-11-26 NOTE — Progress Notes (Signed)
 Follow-Up Visit   Subjective  Henry Wu is a 48 y.o. male who presents for the following: Skin Cancer Screening and Full Body Skin Exam 6 months psoriasis and tbse Hx of  Hematoma at right great toe traumatic    The patient presents for Total-Body Skin Exam (TBSE) for skin cancer screening and mole check. The patient has spots, moles and lesions to be evaluated, some may be new or changing and the patient may have concern these could be cancer.  The following portions of the chart were reviewed this encounter and updated as appropriate: medications, allergies, medical history  Review of Systems:  No other skin or systemic complaints except as noted in HPI or Assessment and Plan.  Objective  Well appearing patient in no apparent distress; mood and affect are within normal limits.  A full examination was performed including scalp, head, eyes, ears, nose, lips, neck, chest, axillae, abdomen, back, buttocks, bilateral upper extremities, bilateral lower extremities, hands, feet, fingers, toes, fingernails, and toenails. All findings within normal limits unless otherwise noted below.   Relevant physical exam findings are noted in the Assessment and Plan.    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  Call if any new or changing lesions are noted between office visits   SKIN CANCER SCREENING PERFORMED TODAY.  ACTINIC DAMAGE - Chronic condition, secondary to cumulative UV/sun exposure - diffuse scaly erythematous macules with underlying dyspigmentation - Recommend daily broad spectrum sunscreen SPF 30+ to sun-exposed areas, reapply every 2  hours as needed.  - Staying in the shade or wearing long sleeves, sun glasses (UVA+UVB protection) and wide brim hats (4-inch brim around the entire circumference of the hat) are also recommended for sun protection.  - Call for new or changing lesions.  LENTIGINES, SEBORRHEIC KERATOSES, HEMANGIOMAS - Benign normal skin lesions - Benign-appearing - Call for any changes  - hemangioma at left flank  MELANOCYTIC NEVI - Tan-brown and/or pink-flesh-colored symmetric macules and papules - Benign appearing on exam today - Observation - Call clinic for new or changing moles - Recommend daily use of broad spectrum spf 30+ sunscreen to sun-exposed areas.   Acrochordons (Skin Tags) - Fleshy, skin-colored pedunculated papules - Benign appearing.  - Observe. - If desired, they can be removed with an in office procedure that is not covered by insurance. - Please call the clinic if you notice any new or changing lesions.   HISTORY OF PRECANCEROUS ACTINIC KERATOSIS At Left superior forehead near hairline  - site(s) of PreCancerous Actinic Keratosis clear today. - these may recur and new lesions may form requiring treatment to prevent transformation into skin cancer - observe for new or changing spots and contact [facility] for appointment if occur - photoprotection with sun protective clothing; sunglasses and broad spectrum sunscreen with SPF of at least 30 + and frequent self skin exams recommended - yearly exams by a dermatologist recommended for persons with history of PreCancerous Actinic Keratoses   PSORIASIS on systemic treatment Improved on Taltz  - Pt pleased and wants to continue. BSA today is 4% on Taltz .  No side effects from treatment. Exam : Fissuring and scale of  elbows  Chronic condition with duration or expected duration over one year. Currently well-controlled. Counseling and coordination of care for severe psoriasis on systemic treatment  Psoriasis - severe on  systemic treatment.  Psoriasis is a chronic non-curable, but treatable genetic/hereditary disease that may have other systemic features affecting other organ systems such as joints (Psoriatic Arthritis).  It is linked with heart disease, inflammatory bowel disease, non-alcoholic fatty liver disease, and depression. Significant skin psoriasis and/or psoriatic arthritis may have significant symptoms and affects activities of daily activity and often benefits from systemic treatments.  These systemic treatments have some potential side effects including immunosuppression and require pre-treatment laboratory screening and periodic laboratory monitoring and periodic in person evaluation and monitoring by the attending dermatologist physician (long term medication management).    Pt with hx of TB, Chest X-ray from 04/17/2024 clear  Hx of Osteoarthritis/degenerative disc disease, Rheumatology 11/07/2017  Treatment Plan: Reviewed previous labs CBC with Diff, Lipid Panel, CMP, - all within normal range   Cont Taltz  80mg /ml sq injections every 4 wks Cont Mometasone  Lotion - use to aa's 3 x a week prn,  Continue Zoryve  cream twice daily to affected areas as needed.   Will order Chest X-ray at follow up   Reviewed risks of biologics including immunosuppression, infections, injection site reaction, and failure to improve condition. Goal is control of skin condition, not cure.  Some older biologics such as Humira and Enbrel may slightly increase risk of malignancy and may worsen congestive heart failure.  Taltz  and Cosentyx may cause inflammatory bowel disease to flare. The use of biologics requires long term medication management, including periodic office visits and monitoring of blood work.    Topical steroids (such as triamcinolone, fluocinolone, fluocinonide, mometasone , clobetasol, halobetasol, betamethasone, hydrocortisone) can cause thinning and lightening of the skin if they are used for too long in the same  area. Your physician has selected the right strength medicine for your problem and area affected on the body. Please use your medication only as directed by your physician to prevent side effects.     Long term medication management.  Patient is using long term (months to years) prescription medication  to control their dermatologic condition.  These medications require periodic monitoring to evaluate for efficacy and side effects and may require periodic laboratory monitoring   Return in about 6 months (around 05/27/2025) for psoriasis/ tbse hx of dysplastic .  IEleanor Blush, CMA, am acting as scribe for Alm Rhyme, MD.   Documentation: I have reviewed the above documentation for accuracy and completeness, and I agree with the above.  Alm Rhyme, MD

## 2024-11-26 NOTE — Patient Instructions (Addendum)

## 2025-05-29 ENCOUNTER — Ambulatory Visit: Admitting: Dermatology
